# Patient Record
Sex: Male | Born: 1955 | Race: White | Hispanic: No | Marital: Married | Smoking: Never smoker
Health system: Southern US, Community
[De-identification: ages and names within clinical notes are randomized; demographics above are authoritative.]

## PROBLEM LIST (undated history)

## (undated) DIAGNOSIS — R1012 Left upper quadrant pain: Secondary | ICD-10-CM

## (undated) HISTORY — DX: Left upper quadrant pain: R10.12

---

## 2000-12-10 ENCOUNTER — Encounter: Admission: RE | Admit: 2000-12-10 | Discharge: 2000-12-10 | Payer: Self-pay | Admitting: Family Medicine

## 2000-12-10 ENCOUNTER — Encounter: Payer: Self-pay | Admitting: Family Medicine

## 2007-08-28 ENCOUNTER — Encounter: Admission: RE | Admit: 2007-08-28 | Discharge: 2007-08-28 | Payer: Self-pay | Admitting: Family Medicine

## 2009-09-02 ENCOUNTER — Ambulatory Visit: Payer: Self-pay | Admitting: Family Medicine

## 2009-09-02 DIAGNOSIS — R1012 Left upper quadrant pain: Secondary | ICD-10-CM

## 2009-09-02 HISTORY — DX: Left upper quadrant pain: R10.12

## 2009-09-06 LAB — CONVERTED CEMR LAB
ALT: 43 units/L (ref 0–53)
AST: 29 units/L (ref 0–37)
Albumin: 4.4 g/dL (ref 3.5–5.2)
Alkaline Phosphatase: 79 units/L (ref 39–117)
BUN: 21 mg/dL (ref 6–23)
Basophils Absolute: 0 10*3/uL (ref 0.0–0.1)
Basophils Relative: 0 % (ref 0.0–3.0)
Bilirubin, Direct: 0.1 mg/dL (ref 0.0–0.3)
CO2: 32 meq/L (ref 19–32)
Calcium: 9.5 mg/dL (ref 8.4–10.5)
Chloride: 105 meq/L (ref 96–112)
Cholesterol: 200 mg/dL (ref 0–200)
Creatinine, Ser: 1.1 mg/dL (ref 0.4–1.5)
Eosinophils Absolute: 0.1 10*3/uL (ref 0.0–0.7)
Eosinophils Relative: 1.1 % (ref 0.0–5.0)
GFR calc non Af Amer: 74.75 mL/min (ref >60)
Glucose, Bld: 93 mg/dL (ref 70–99)
HCT: 46.1 % (ref 39.0–52.0)
HDL: 40.2 mg/dL (ref >39.00)
Hemoglobin: 15.9 g/dL (ref 13.0–17.0)
LDL Cholesterol: 142 mg/dL — ABNORMAL HIGH (ref 0–99)
Lymphocytes Relative: 28.8 % (ref 12.0–46.0)
Lymphs Abs: 1.9 10*3/uL (ref 0.7–4.0)
MCHC: 34.5 g/dL (ref 30.0–36.0)
MCV: 93.7 fL (ref 78.0–100.0)
Monocytes Absolute: 0.7 10*3/uL (ref 0.1–1.0)
Monocytes Relative: 10.1 % (ref 3.0–12.0)
Neutro Abs: 4 10*3/uL (ref 1.4–7.7)
Neutrophils Relative %: 60 % (ref 43.0–77.0)
PSA: 0.76 ng/mL (ref 0.10–4.00)
Platelets: 159 10*3/uL (ref 150.0–400.0)
Potassium: 4.7 meq/L (ref 3.5–5.1)
RBC: 4.92 M/uL (ref 4.22–5.81)
RDW: 12.5 % (ref 11.5–14.6)
Sodium: 142 meq/L (ref 135–145)
TSH: 0.58 microintl units/mL (ref 0.35–5.50)
Total Bilirubin: 1.3 mg/dL — ABNORMAL HIGH (ref 0.3–1.2)
Total CHOL/HDL Ratio: 5
Total Protein: 7.1 g/dL (ref 6.0–8.3)
Triglycerides: 90 mg/dL (ref 0.0–149.0)
VLDL: 18 mg/dL (ref 0.0–40.0)
WBC: 6.7 10*3/uL (ref 4.5–10.5)

## 2009-09-26 ENCOUNTER — Ambulatory Visit: Payer: Self-pay | Admitting: Family Medicine

## 2009-09-26 LAB — CONVERTED CEMR LAB
OCCULT 1: NEGATIVE
OCCULT 2: NEGATIVE
OCCULT 3: NEGATIVE

## 2009-10-14 ENCOUNTER — Telehealth: Payer: Self-pay | Admitting: Family Medicine

## 2009-12-05 ENCOUNTER — Ambulatory Visit: Payer: Self-pay | Admitting: Family Medicine

## 2010-10-18 ENCOUNTER — Ambulatory Visit: Payer: Self-pay | Admitting: Family Medicine

## 2010-10-18 LAB — CONVERTED CEMR LAB
ALT: 46 units/L (ref 0–53)
AST: 33 units/L (ref 0–37)
Albumin: 4 g/dL (ref 3.5–5.2)
Alkaline Phosphatase: 62 units/L (ref 39–117)
BUN: 24 mg/dL — ABNORMAL HIGH (ref 6–23)
Basophils Absolute: 0 10*3/uL (ref 0.0–0.1)
Basophils Relative: 0.6 % (ref 0.0–3.0)
Bilirubin Urine: NEGATIVE
Bilirubin, Direct: 0.1 mg/dL (ref 0.0–0.3)
Blood in Urine, dipstick: NEGATIVE
CO2: 26 meq/L (ref 19–32)
Calcium: 9.1 mg/dL (ref 8.4–10.5)
Chloride: 107 meq/L (ref 96–112)
Cholesterol: 220 mg/dL — ABNORMAL HIGH (ref 0–200)
Creatinine, Ser: 1 mg/dL (ref 0.4–1.5)
Direct LDL: 141.7 mg/dL
Eosinophils Absolute: 0.1 10*3/uL (ref 0.0–0.7)
Eosinophils Relative: 1.7 % (ref 0.0–5.0)
GFR calc non Af Amer: 83.07 mL/min (ref >60)
Glucose, Bld: 77 mg/dL (ref 70–99)
Glucose, Urine, Semiquant: NEGATIVE
HCT: 47.2 % (ref 39.0–52.0)
HDL: 42.4 mg/dL (ref >39.00)
Hemoglobin: 15.8 g/dL (ref 13.0–17.0)
Ketones, urine, test strip: NEGATIVE
Lymphocytes Relative: 30.2 % (ref 12.0–46.0)
Lymphs Abs: 2.1 10*3/uL (ref 0.7–4.0)
MCHC: 33.4 g/dL (ref 30.0–36.0)
MCV: 95.9 fL (ref 78.0–100.0)
Monocytes Absolute: 0.7 10*3/uL (ref 0.1–1.0)
Monocytes Relative: 10.6 % (ref 3.0–12.0)
Neutro Abs: 4 10*3/uL (ref 1.4–7.7)
Neutrophils Relative %: 56.9 % (ref 43.0–77.0)
Nitrite: NEGATIVE
PSA: 0.57 ng/mL (ref 0.10–4.00)
Platelets: 179 10*3/uL (ref 150.0–400.0)
Potassium: 3.7 meq/L (ref 3.5–5.1)
Protein, U semiquant: NEGATIVE
RBC: 4.92 M/uL (ref 4.22–5.81)
RDW: 13.1 % (ref 11.5–14.6)
Sodium: 141 meq/L (ref 135–145)
Specific Gravity, Urine: 1.025
TSH: 1.04 microintl units/mL (ref 0.35–5.50)
Total Bilirubin: 1 mg/dL (ref 0.3–1.2)
Total CHOL/HDL Ratio: 5
Total Protein: 6.6 g/dL (ref 6.0–8.3)
Triglycerides: 150 mg/dL — ABNORMAL HIGH (ref 0.0–149.0)
Urobilinogen, UA: 0.2
VLDL: 30 mg/dL (ref 0.0–40.0)
WBC Urine, dipstick: NEGATIVE
WBC: 7.1 10*3/uL (ref 4.5–10.5)
pH: 5.5

## 2010-10-27 ENCOUNTER — Ambulatory Visit: Payer: Self-pay | Admitting: Family Medicine

## 2011-01-30 NOTE — Assessment & Plan Note (Signed)
Summary: cpx/flu shot/cjr   Vital Signs:  Patient profile:   55 year old male Height:      71.25 inches Weight:      226 pounds BMI:     31.41 Temp:     98.3 degrees F oral Pulse rate:   60 / minute Pulse rhythm:   regular Resp:     12 per minute BP sitting:   116 / 86  (left arm) Cuff size:   regular  Vitals Entered By: Sid Falcon LPN (October 27, 2010 3:06 PM)  Nutrition Counseling: Patient's BMI is greater than 25 and therefore counseled on weight management options.  History of Present Illness: Here for CPE.  Exercises few times per week. Colonoscopy couple of years ago with adenomatous polyp. Flu vaccine given today  Clinical Review Panels:  Prevention   Last PSA:  0.57 (10/18/2010)  Immunizations   Last Flu Vaccine:  Fluvax 3+ (10/27/2010)  Lipid Management   Cholesterol:  220 (10/18/2010)   LDL (bad choesterol):  142 (09/02/2009)   HDL (good cholesterol):  42.40 (10/18/2010)  Diabetes Management   Creatinine:  1.0 (10/18/2010)   Last Flu Vaccine:  Fluvax 3+ (10/27/2010)  CBC   WBC:  7.1 (10/18/2010)   RBC:  4.92 (10/18/2010)   Hgb:  15.8 (10/18/2010)   Hct:  47.2 (10/18/2010)   Platelets:  179.0 (10/18/2010)   MCV  95.9 (10/18/2010)   MCHC  33.4 (10/18/2010)   RDW  13.1 (10/18/2010)   PMN:  56.9 (10/18/2010)   Lymphs:  30.2 (10/18/2010)   Monos:  10.6 (10/18/2010)   Eosinophils:  1.7 (10/18/2010)   Basophil:  0.6 (10/18/2010)  Complete Metabolic Panel   Glucose:  77 (10/18/2010)   Sodium:  141 (10/18/2010)   Potassium:  3.7 (10/18/2010)   Chloride:  107 (10/18/2010)   CO2:  26 (10/18/2010)   BUN:  24 (10/18/2010)   Creatinine:  1.0 (10/18/2010)   Albumin:  4.0 (10/18/2010)   Total Protein:  6.6 (10/18/2010)   Calcium:  9.1 (10/18/2010)   Total Bili:  1.0 (10/18/2010)   Alk Phos:  62 (10/18/2010)   SGPT (ALT):  46 (10/18/2010)   SGOT (AST):  33 (10/18/2010)   Allergies (verified): No Known Drug Allergies  Past History:  Family  History: Last updated: 09/02/2009 Family history atrial fib, father  Social History: Last updated: 09/02/2009 Occupation:  Interior and spatial designer of facilities, [employer] Married Never Smoked Alcohol use-yes, occasional red wine Regular exercise-yes  Risk Factors: Exercise: yes (09/02/2009)  Risk Factors: Smoking Status: never (09/02/2009) PMH-FH-SH reviewed for relevance  Review of Systems  The patient denies anorexia, fever, weight loss, weight gain, vision loss, decreased hearing, hoarseness, chest pain, syncope, dyspnea on exertion, peripheral edema, prolonged cough, headaches, hemoptysis, abdominal pain, melena, hematochezia, severe indigestion/heartburn, hematuria, incontinence, genital sores, muscle weakness, suspicious skin lesions, transient blindness, difficulty walking, depression, unusual weight change, abnormal bleeding, enlarged lymph nodes, and testicular masses.    Physical Exam  General:  Well-developed,well-nourished,in no acute distress; alert,appropriate and cooperative throughout examination Head:  Normocephalic and atraumatic without obvious abnormalities. No apparent alopecia or balding. Eyes:  No corneal or conjunctival inflammation noted. EOMI. Perrla. Funduscopic exam benign, without hemorrhages, exudates or papilledema. Vision grossly normal. Ears:  External ear exam shows no significant lesions or deformities.  Otoscopic examination reveals clear canals, tympanic membranes are intact bilaterally without bulging, retraction, inflammation or discharge. Hearing is grossly normal bilaterally. Mouth:  Oral mucosa and oropharynx without lesions or exudates.  Teeth in good repair.  Neck:  No deformities, masses, or tenderness noted. Chest Wall:  small rounded sebaceous cyst ant chest wall Lungs:  Normal respiratory effort, chest expands symmetrically. Lungs are clear to auscultation, no crackles or wheezes. Heart:  Normal rate and regular rhythm. S1 and S2 normal without  gallop, murmur, click, rub or other extra sounds. Abdomen:  Bowel sounds positive,abdomen soft and non-tender without masses, organomegaly or hernias noted. Msk:  No deformity or scoliosis noted of thoracic or lumbar spine.   Extremities:  No clubbing, cyanosis, edema, or deformity noted with normal full range of motion of all joints.   Neurologic:  No cranial nerve deficits noted. Station and gait are normal. Plantar reflexes are down-going bilaterally. DTRs are symmetrical throughout. Sensory, motor and coordinative functions appear intact. Skin:  Intact without suspicious lesions or rashes Cervical Nodes:  No lymphadenopathy noted Psych:  normally interactive, good eye contact, not anxious appearing, and not depressed appearing.     Impression & Recommendations:  Problem # 1:  ROUTINE GENERAL MEDICAL EXAM@[email or web address]  CARE FACL (ICD-V70.0) labs reviewed with patient. Flu vaccine given. Repeat colonoscopy next year.  Complete Medication List: 1)  Glucosamine 500 Mg Caps (Glucosamine sulfate) .... Once daily 2)  Multi Vitamin Mens Tabs (Multiple vitamin) .... Once daily 3)  Prilosec Otc 20 Mg Tbec (Omeprazole magnesium) .... As needed  Other Orders: Admin 1st Vaccine (16109) Flu Vaccine 8yrs + [phone])   Orders Added: 1)  Est. Patient 40-64 years [99396] 2)  Admin 1st Vaccine [90471] 3)  Flu Vaccine 14yrs + [09811]   Flu Vaccine Consent Questions     Do you have a history of severe allergic reactions to this vaccine? no    Any prior history of allergic reactions to egg and/or gelatin? no    Do you have a sensitivity to the preservative Thimersol? no    Do you have a past history of Guillan-Barre Syndrome? no    Do you currently have an acute febrile illness? no    Have you ever had a severe reaction to latex? no    Vaccine information given and explained to patient? yes    Are you currently pregnant? no    Lot Number:[id number]   Exp Date:06/30/2011   Site Given  Left Deltoid  IM         .[email or web address]

## 2011-11-29 ENCOUNTER — Telehealth: Payer: Self-pay | Admitting: Family Medicine

## 2011-11-29 NOTE — Telephone Encounter (Signed)
yes

## 2011-11-29 NOTE — Telephone Encounter (Signed)
Pt would like cpx before 12-31-11. Can I work pt in?

## 2011-11-30 NOTE — Telephone Encounter (Signed)
Pt has been sch cpx 12-12-11 2:00pm

## 2011-12-05 ENCOUNTER — Other Ambulatory Visit (INDEPENDENT_AMBULATORY_CARE_PROVIDER_SITE_OTHER): Payer: 59

## 2011-12-05 DIAGNOSIS — Z0000 Encounter for general adult medical examination without abnormal findings: Secondary | ICD-10-CM

## 2011-12-05 DIAGNOSIS — Z23 Encounter for immunization: Secondary | ICD-10-CM

## 2011-12-05 LAB — BASIC METABOLIC PANEL
BUN: 22 mg/dL (ref 6–23)
CO2: 30 mEq/L (ref 19–32)
Calcium: 9.2 mg/dL (ref 8.4–10.5)
Chloride: 104 mEq/L (ref 96–112)
Creatinine, Ser: 1 mg/dL (ref 0.4–1.5)
GFR: 83.68 mL/min (ref >60.00)
Glucose, Bld: 85 mg/dL (ref 70–99)
Potassium: 3.9 mEq/L (ref 3.5–5.1)
Sodium: 139 mEq/L (ref 135–145)

## 2011-12-05 LAB — CBC WITH DIFFERENTIAL/PLATELET
Basophils Relative: 0.6 % (ref 0.0–3.0)
Eosinophils Absolute: 0.1 10*3/uL (ref 0.0–0.7)
Eosinophils Relative: 1.4 % (ref 0.0–5.0)
HCT: 46.5 % (ref 39.0–52.0)
Hemoglobin: 15.8 g/dL (ref 13.0–17.0)
Lymphs Abs: 2 10*3/uL (ref 0.7–4.0)
MCHC: 33.9 g/dL (ref 30.0–36.0)
MCV: 93.8 fl (ref 78.0–100.0)
Monocytes Absolute: 0.6 10*3/uL (ref 0.1–1.0)
Neutro Abs: 3.3 10*3/uL (ref 1.4–7.7)
RBC: 4.96 Mil/uL (ref 4.22–5.81)
WBC: 6.1 10*3/uL (ref 4.5–10.5)

## 2011-12-05 LAB — HEPATIC FUNCTION PANEL
ALT: 29 U/L (ref 0–53)
Albumin: 4.2 g/dL (ref 3.5–5.2)
Bilirubin, Direct: 0.1 mg/dL (ref 0.0–0.3)
Total Protein: 6.7 g/dL (ref 6.0–8.3)

## 2011-12-05 LAB — LDL CHOLESTEROL, DIRECT: Direct LDL: 150.4 mg/dL

## 2011-12-05 LAB — POCT URINALYSIS DIPSTICK
Bilirubin, UA: NEGATIVE
Ketones, UA: NEGATIVE
Leukocytes, UA: NEGATIVE
Spec Grav, UA: 1.015
pH, UA: 7

## 2011-12-05 LAB — LIPID PANEL
Cholesterol: 243 mg/dL — ABNORMAL HIGH (ref 0–200)
Triglycerides: 134 mg/dL (ref 0.0–149.0)

## 2011-12-10 ENCOUNTER — Telehealth: Payer: Self-pay | Admitting: Internal Medicine

## 2011-12-10 NOTE — Telephone Encounter (Signed)
Pt would like blood work results °

## 2011-12-10 NOTE — Telephone Encounter (Signed)
I spoke with pt, labs faxed to [facility], MD office today for appt.

## 2011-12-12 ENCOUNTER — Ambulatory Visit (INDEPENDENT_AMBULATORY_CARE_PROVIDER_SITE_OTHER): Payer: 59 | Admitting: Family Medicine

## 2011-12-12 ENCOUNTER — Encounter: Payer: Self-pay | Admitting: Family Medicine

## 2011-12-12 VITALS — BP 120/82 | HR 60 | Temp 98.4°F | Resp 12 | Ht 72.0 in | Wt 236.0 lb

## 2011-12-12 DIAGNOSIS — Z0000 Encounter for general adult medical examination without abnormal findings: Secondary | ICD-10-CM

## 2011-12-12 MED ORDER — TETANUS-DIPHTH-ACELL PERTUSSIS 5-2.5-18.5 LF-MCG/0.5 IM SUSP: 0.5000 mL | Freq: Once | INTRAMUSCULAR | Status: DC

## 2011-12-12 NOTE — Progress Notes (Signed)
  Subjective:    Patient ID: William English, male    DOB: [birth year], 55 y.o.   MRN: [id number]  HPI  Patient here for complete physical. He has no chronic medical problems. Takes no regular medications. Nonsmoker. No alcohol use. Last tetanus 2002. Exercises with walking and occasional running. Colonoscopy up to date. Past medical history, social history, and family history reviewed and as below.  Past Medical History  Diagnosis Date  . Abdominal pain, left upper quadrant 09/02/2009   History reviewed. No pertinent past surgical history.  reports that he has never smoked. He does not have any smokeless tobacco history on file. His alcohol and drug histories not on file. family history includes Heart disease in his father. No Known Allergies    Review of Systems  Constitutional: Negative for fever, activity change, appetite change and fatigue.  HENT: Negative for ear pain, congestion and trouble swallowing.   Eyes: Negative for pain and visual disturbance.  Respiratory: Negative for cough, shortness of breath and wheezing.   Cardiovascular: Negative for chest pain and palpitations.  Gastrointestinal: Negative for nausea, vomiting, abdominal pain, diarrhea, constipation, blood in stool, abdominal distention and rectal pain.  Genitourinary: Negative for dysuria, hematuria and testicular pain.  Musculoskeletal: Negative for joint swelling and arthralgias.  Skin: Negative for rash.  Neurological: Negative for dizziness, syncope and headaches.  Hematological: Negative for adenopathy.  Psychiatric/Behavioral: Negative for confusion and dysphoric mood.       Objective:   Physical Exam  Constitutional: He is oriented to person, place, and time. He appears well-developed and well-nourished. No distress.  HENT:  Head: Normocephalic and atraumatic.  Right Ear: External ear normal.  Left Ear: External ear normal.  Mouth/Throat: Oropharynx is clear and moist.  Eyes: Conjunctivae and EOM are  normal. Pupils are equal, round, and reactive to light.  Neck: Normal range of motion. Neck supple. No thyromegaly present.  Cardiovascular: Normal rate, regular rhythm and normal heart sounds.   No murmur heard. Pulmonary/Chest: No respiratory distress. He has no wheezes. He has no rales.       Sebaceous cyst MID upper chest wall. No signs of inflammation such as warmth or erythema. Nontender.  Abdominal: Soft. Bowel sounds are normal. He exhibits no distension and no mass. There is no tenderness. There is no rebound and no guarding.  Musculoskeletal: He exhibits no edema.  Lymphadenopathy:    He has no cervical adenopathy.  Neurological: He is alert and oriented to person, place, and time. He displays normal reflexes. No cranial nerve deficit.  Skin: No rash noted.  Psychiatric: He has a normal mood and affect. His behavior is normal.          Assessment & Plan:  Healthy 55 year old male. Discussed weight control and regular exercise. Labs reviewed with patient. Reduce saturated fats. Increase soluble fiber intake.  Tetanus booster given. Due for repeat colonoscopy next year

## 2011-12-26 ENCOUNTER — Encounter: Payer: Self-pay | Admitting: Family Medicine

## 2012-01-04 ENCOUNTER — Ambulatory Visit (INDEPENDENT_AMBULATORY_CARE_PROVIDER_SITE_OTHER): Payer: 59 | Admitting: Family Medicine

## 2012-01-04 ENCOUNTER — Encounter: Payer: Self-pay | Admitting: Family Medicine

## 2012-01-04 VITALS — BP 130/70 | Temp 98.1°F | Wt 242.0 lb

## 2012-01-04 DIAGNOSIS — Z299 Encounter for prophylactic measures, unspecified: Secondary | ICD-10-CM

## 2012-01-04 DIAGNOSIS — Z23 Encounter for immunization: Secondary | ICD-10-CM

## 2012-01-04 DIAGNOSIS — Z418 Encounter for other procedures for purposes other than remedying health state: Secondary | ICD-10-CM

## 2012-01-04 MED ORDER — ATOVAQUONE-PROGUANIL HCL 250-100 MG PO TABS: ORAL_TABLET | ORAL | Status: DC

## 2012-01-04 MED ORDER — TYPHOID VACCINE PO CPDR: 1.0000 | DELAYED_RELEASE_CAPSULE | ORAL | Status: AC

## 2012-01-04 NOTE — Progress Notes (Signed)
  Subjective:    Patient ID: William English, male    DOB: 1956/07/15, 56 y.o.   MRN: [id number]  HPI  Here for vaccinations for upcoming travel. Planned mission trip to [place] in March.  Tetanus up to date. Influenza today. No history of hepatitis A. 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 We needs typhoid vaccine and malaria prevention. He has no chronic medical problems. No immune problems. No known drug allergies.   Review of Systems  Constitutional: Negative for fever, chills and unexpected weight change.  Respiratory: Negative for cough and shortness of breath.   Cardiovascular: Negative for chest pain.       Objective:   Physical Exam  Constitutional: He appears well-developed and well-nourished.  Cardiovascular: Normal rate and regular rhythm.   No murmur heard. Pulmonary/Chest: Effort normal and breath sounds normal. No respiratory distress. He has no wheezes. He has no rales.          Assessment & Plan:  Immunization prevention. Hepatitis A vaccine given with recommendation for booster in 6 months. Oral typhoid vaccine given. No contraindications. Malaria prevention with Malarone. He knows not to overlap treatment of Vivotif with malarone.

## 2012-01-25 ENCOUNTER — Encounter: Payer: Self-pay | Admitting: Family Medicine

## 2012-01-25 ENCOUNTER — Ambulatory Visit (INDEPENDENT_AMBULATORY_CARE_PROVIDER_SITE_OTHER): Payer: 59 | Admitting: Family Medicine

## 2012-01-25 VITALS — BP 130/78 | Temp 98.0°F

## 2012-01-25 DIAGNOSIS — Z23 Encounter for immunization: Secondary | ICD-10-CM

## 2012-01-25 DIAGNOSIS — L600 Ingrowing nail: Secondary | ICD-10-CM

## 2012-01-25 DIAGNOSIS — IMO0002 Reserved for concepts with insufficient information to code with codable children: Secondary | ICD-10-CM

## 2012-01-25 DIAGNOSIS — Z7189 Other specified counseling: Secondary | ICD-10-CM

## 2012-01-25 MED ORDER — CIPROFLOXACIN HCL 500 MG PO TABS: ORAL_TABLET | ORAL | Status: DC

## 2012-01-25 NOTE — Progress Notes (Signed)
  Subjective:    Patient ID: William English, male    DOB: Mar 09, 1956, 56 y.o.   MRN: [id number]  HPI  Here for several issues as follows  Ingrown right great toenail. Present for several months. No granulation tissue or drainage. Request having medial wedge excised.  Becoming more painful with ambulation.  No recent secondary infection.  Upcoming trip to [place]. Someone had suggested repeat MMR and possibly polio vaccine. Also requesting Cipro for traveler's diarrhea. He recently had oral typhoid vaccine and has a prescription for malaria prevention. He's not sure if he's had one or 2 previous measles mumps rubella immunizations.   Review of Systems  Constitutional: Negative for fever and chills.  Respiratory: Negative for cough.   Hematological: Negative for adenopathy.       Objective:   Physical Exam  Constitutional: He appears well-developed and well-nourished.  Cardiovascular: Normal rate and regular rhythm.   Musculoskeletal:       Ingrown right great toe medial border. No granulation tissue minimal tenderness along the ingrown border          Assessment & Plan:  #1 ingrown right great toenail. After discussing risks and benefits patient consented to excision. Anesthesia with digital block 1% plain Xylocaine. Prep with Betadine. Removed involved one quarter of the involved nail without difficulty. Minimal bleeding. Antibiotic and dressing applied. #2 immunizations. Cannot confirm second MMR. MMR booster given. Also prescription for Cipro for traveler's diarrhea to use prn.  His other immunizations appear to be up to date.

## 2012-01-25 NOTE — Patient Instructions (Signed)
Keep toe dry for 24 hours then clean daily with soap and water. Topical antibiotic daily for 3-4 days Follow up promptly for signs of infection such as redness or pus like drainage.

## 2012-01-31 ENCOUNTER — Telehealth: Payer: Self-pay | Admitting: Family Medicine

## 2012-01-31 NOTE — Telephone Encounter (Signed)
Pt will be travelling to [place] mid-march requesting plain polio vaccine.

## 2012-02-01 NOTE — Telephone Encounter (Signed)
Pt informed we can order it from [facility], would cost pt $239.53.  Pt is going to check with the health department and will let us know.

## 2012-05-08 ENCOUNTER — Ambulatory Visit (INDEPENDENT_AMBULATORY_CARE_PROVIDER_SITE_OTHER): Payer: 59 | Admitting: Family Medicine

## 2012-05-08 ENCOUNTER — Encounter: Payer: Self-pay | Admitting: Family Medicine

## 2012-05-08 VITALS — BP 110/70 | Temp 98.0°F | Wt 237.0 lb

## 2012-05-08 DIAGNOSIS — R1011 Right upper quadrant pain: Secondary | ICD-10-CM

## 2012-05-08 LAB — HEPATIC FUNCTION PANEL
ALT: 28 U/L (ref 0–53)
AST: 25 U/L (ref 0–37)
Albumin: 4 g/dL (ref 3.5–5.2)
Alkaline Phosphatase: 56 U/L (ref 39–117)
Total Protein: 6.9 g/dL (ref 6.0–8.3)

## 2012-05-08 LAB — CBC WITH DIFFERENTIAL/PLATELET
Basophils Relative: 0.6 % (ref 0.0–3.0)
Eosinophils Relative: 1.3 % (ref 0.0–5.0)
HCT: 45.3 % (ref 39.0–52.0)
Hemoglobin: 15.2 g/dL (ref 13.0–17.0)
Lymphocytes Relative: 32.6 % (ref 12.0–46.0)
Lymphs Abs: 2 10*3/uL (ref 0.7–4.0)
Monocytes Relative: 9.5 % (ref 3.0–12.0)
Neutro Abs: 3.5 10*3/uL (ref 1.4–7.7)
RBC: 4.85 Mil/uL (ref 4.22–5.81)
RDW: 13 % (ref 11.5–14.6)
WBC: 6.3 10*3/uL (ref 4.5–10.5)

## 2012-05-08 NOTE — Progress Notes (Signed)
  Subjective:    Patient ID: William English, male    DOB: Jul 01, 1956, 56 y.o.   MRN: [id number]  HPI  Two-week history of intermittent right upper quadrant abdominal pain. The pain varies somewhat in intensity and quality is somewhat of an achy quality. No clear exacerbating factors. It occurs both day and night. Not clearly related to eating. He had similar type episode about 5 years ago with normal ultrasound then. He had recent colonoscopy which was unremarkable-other than benign polyps.  Denies any appetite or weight changes. No nausea or vomiting. Pain does not radiate. No clear exacerbating factors. No alleviating factors. Pain is moderate in intensity. Has not seen any associated skin rashes. No change in bowel habits.  Past Medical History  Diagnosis Date  . Abdominal pain, left upper quadrant 09/02/2009   No past surgical history on file.  reports that he has never smoked. He does not have any smokeless tobacco history on file. His alcohol and drug histories not on file. family history includes Heart disease in his father. No Known Allergies    Review of Systems  Constitutional: Negative for fever, chills, appetite change and unexpected weight change.  Respiratory: Negative for cough and shortness of breath.   Cardiovascular: Negative for chest pain.  Gastrointestinal: Positive for abdominal pain. Negative for nausea, vomiting, diarrhea, constipation and blood in stool.  Genitourinary: Negative for dysuria.  Neurological: Negative for dizziness.       Objective:   Physical Exam  Constitutional: He appears well-developed and well-nourished.  Cardiovascular: Normal rate and regular rhythm.   Pulmonary/Chest: Effort normal and breath sounds normal. No respiratory distress. He has no wheezes. He has no rales.  Abdominal: Soft. Bowel sounds are normal. He exhibits no distension and no mass. There is no rebound and no guarding.       Minimal tenderness right upper quadrant to deep  palpation. No hepatomegaly. No guarding          Assessment & Plan:  Intermittent right upper quadrant abdominal pain. Etiology unclear. Check labs with hepatic panel and CBC. Repeat abdominal ultrasound since his than several years ago since last.

## 2012-05-09 ENCOUNTER — Ambulatory Visit
Admission: RE | Admit: 2012-05-09 | Discharge: 2012-05-09 | Disposition: A | Discharge status: Home / Self Care | Payer: PRIVATE HEALTH INSURANCE | Source: Ambulatory Visit | Attending: Family Medicine | Admitting: Family Medicine

## 2012-05-09 DIAGNOSIS — R1011 Right upper quadrant pain: Secondary | ICD-10-CM

## 2012-05-09 NOTE — Progress Notes (Signed)
Quick Note:  Pt informed ______ 

## 2012-05-13 ENCOUNTER — Other Ambulatory Visit: Payer: 59

## 2012-06-11 ENCOUNTER — Ambulatory Visit (INDEPENDENT_AMBULATORY_CARE_PROVIDER_SITE_OTHER): Payer: PRIVATE HEALTH INSURANCE | Admitting: Family Medicine

## 2012-06-11 DIAGNOSIS — Z299 Encounter for prophylactic measures, unspecified: Secondary | ICD-10-CM

## 2012-06-11 DIAGNOSIS — Z23 Encounter for immunization: Secondary | ICD-10-CM

## 2012-07-24 ENCOUNTER — Ambulatory Visit (INDEPENDENT_AMBULATORY_CARE_PROVIDER_SITE_OTHER): Payer: PRIVATE HEALTH INSURANCE | Admitting: Family Medicine

## 2012-07-24 ENCOUNTER — Encounter: Payer: Self-pay | Admitting: Family Medicine

## 2012-07-24 VITALS — BP 112/78 | Temp 98.0°F | Wt 240.0 lb

## 2012-07-24 DIAGNOSIS — L600 Ingrowing nail: Secondary | ICD-10-CM

## 2012-07-24 NOTE — Progress Notes (Signed)
  Subjective:    Patient ID: William English, male    DOB: 12-03-1956, 56 y.o.   MRN: [id number]  HPI  Recurrent right great toenail. Partial excision back in the winter. This healed well. He's recently had a couple slivers of nail he tried to cut out himself without success. No granulation tissue. No drainage. He has some mild pain with ambulation.   Review of Systems  Constitutional: Negative for fever and chills.  Musculoskeletal: Negative for gait problem.       Objective:   Physical Exam  Constitutional: He appears well-developed and well-nourished.  Cardiovascular: Normal rate and regular rhythm.   Musculoskeletal:       Right great toe reveals that he has some recurrent ingrown nail along the medial border. He is a couple of pieces of nail he tried to trim out unsuccessfully. No granulation tissue. No cellulitis changes. No drainage.          Assessment & Plan:  Recurrent right great toe ingrown. Discussed risk and benefits of repeat excision and patient consents. Toe prepped with Betadine. Digital block 1% plain Xylocaine. Removed involved portion of ingrown nail without difficulty. Minimal bleeding. Antibiotic and dressing applied. Wound care structure given. If he has recurrence again consider podiatry referral for consideration of nail matrix ablation

## 2012-07-31 ENCOUNTER — Ambulatory Visit: Payer: PRIVATE HEALTH INSURANCE | Admitting: Family Medicine

## 2012-09-05 ENCOUNTER — Ambulatory Visit (INDEPENDENT_AMBULATORY_CARE_PROVIDER_SITE_OTHER): Payer: PRIVATE HEALTH INSURANCE | Admitting: Family Medicine

## 2012-09-05 ENCOUNTER — Encounter: Payer: Self-pay | Admitting: Family Medicine

## 2012-09-05 VITALS — BP 118/90 | Temp 97.8°F | Wt 241.0 lb

## 2012-09-05 DIAGNOSIS — R599 Enlarged lymph nodes, unspecified: Secondary | ICD-10-CM

## 2012-09-05 DIAGNOSIS — R591 Generalized enlarged lymph nodes: Secondary | ICD-10-CM

## 2012-09-05 NOTE — Patient Instructions (Addendum)
Touch base in 2 weeks if lymph nodes not decreasing in size.

## 2012-09-05 NOTE — Progress Notes (Signed)
  Subjective:    Patient ID: William English, male    DOB: May 07, 1956, 56 y.o.   MRN: [id number]  HPI  Small left chest wall mass noted one week ago. Asymptomatic. No nipple discharge. No nipple inversion. No appetite or weight changes. No other lymphadenopathy noted. No skin changes. Patient generally feels well. He has not noted any other adenopathy in the neck or axillary region   Review of Systems  Constitutional: Negative for fever, chills, appetite change and unexpected weight change.  Gastrointestinal: Negative for abdominal pain.  Hematological: Does not bruise/bleed easily.       Objective:   Physical Exam  Constitutional: He appears well-developed and well-nourished.  Cardiovascular: Normal rate and regular rhythm.   Pulmonary/Chest: Effort normal and breath sounds normal. No respiratory distress. He has no wheezes. He has no rales.       Patient has no breast masses. Around the 1:00 position just lateral and superior to the area all region he has small palpated mass which is very likely lymph node. This is mobile and about 1/2 cm diameter. Nontender. No skin changes. No axillary adenopathy. No supraclavicular adenopathy. No cervical adenopathy          Assessment & Plan:  Small lymph node left lateral breast region. Likely benign. No other adenopathy noted. No breast masses. Observe for now. Touch base 2 weeks if this is not resolving and sooner if getting larger or any other nodes noted

## 2012-09-29 ENCOUNTER — Telehealth: Payer: Self-pay | Admitting: Family Medicine

## 2012-09-29 DIAGNOSIS — R591 Generalized enlarged lymph nodes: Secondary | ICD-10-CM

## 2012-09-29 NOTE — Telephone Encounter (Signed)
Pt was seen on 09-05-2012. Pt is still have experiencing swollen lymph nodes in left breast

## 2012-09-30 NOTE — Telephone Encounter (Signed)
Pt informed on cell VM 

## 2012-09-30 NOTE — Telephone Encounter (Signed)
I recommend refer to general surgeon for further evaluation.  I will make referral.

## 2012-10-14 ENCOUNTER — Ambulatory Visit (INDEPENDENT_AMBULATORY_CARE_PROVIDER_SITE_OTHER): Payer: PRIVATE HEALTH INSURANCE | Admitting: Surgery

## 2012-10-14 ENCOUNTER — Encounter (INDEPENDENT_AMBULATORY_CARE_PROVIDER_SITE_OTHER): Payer: Self-pay | Admitting: Surgery

## 2012-10-14 VITALS — BP 142/60 | HR 76 | Temp 97.5°F | Resp 16 | Ht 72.0 in | Wt 242.4 lb

## 2012-10-14 DIAGNOSIS — N6089 Other benign mammary dysplasias of unspecified breast: Secondary | ICD-10-CM | POA: Insufficient documentation

## 2012-10-14 DIAGNOSIS — K8020 Calculus of gallbladder without cholecystitis without obstruction: Secondary | ICD-10-CM

## 2012-10-14 DIAGNOSIS — K8050 Calculus of bile duct without cholangitis or cholecystitis without obstruction: Secondary | ICD-10-CM

## 2012-10-14 NOTE — Progress Notes (Signed)
Patient ID: William English, male   DOB: 06-05-1956, 56 y.o.   MRN: [id number]  Chief Complaint  Patient presents with  . Pre-op Exam    eval of lymphademopathy    HPI William English is a 56 y.o. male.  Referred by Dr. Evelena Peat for evaluation of left axillary lymphadenopathy HPI This is a healthy 56 year old male presents with recent onset of a palpable mass in the left side of his chest near the axilla. The patient is unsure how long this has been present. This area has been closely observed for the last 6 weeks and has not changed in size. There is no sign of any inflammation. No systemic symptoms of night sweats, easy bruising or significant fatigue. The patient also has had long-standing symptoms of postprandial right upper quadrant discomfort.This has been associated with significant bloating, belching, flatus, and mild nausea. He denies any significant diarrhea. Ultrasound performed in May of 2013 showed no sign of gallstones.  PMH - none  History reviewed. No pertinent past surgical history.  Family History  Problem Relation Age of Onset  . Heart disease Father     atrial fibrillation    Social History History  Substance Use Topics  . Smoking status: Never Smoker   . Smokeless tobacco: Never Used  . Alcohol Use: Yes     occ glass of wine with dinner    No Known Allergies  Current Outpatient Prescriptions  Medication Sig Dispense Refill  . Ascorbic Acid (VITAMIN C) 100 MG tablet Take 100 mg by mouth daily.      Marland Kitchen glucosamine-chondroitin 500-400 MG tablet Take 1 tablet by mouth daily.      . Multiple Vitamins-Minerals (MULTIVITAMIN WITH MINERALS) tablet Take 1 tablet by mouth daily.      Marland Kitchen omeprazole (PRILOSEC) 20 MG capsule Take 20 mg by mouth as needed.         Review of Systems Review of Systems  Constitutional: Negative for fever, chills and unexpected weight change.  HENT: Negative for hearing loss, congestion, sore throat, trouble swallowing and voice change.     Eyes: Negative for visual disturbance.  Respiratory: Negative for cough and wheezing.   Cardiovascular: Negative for chest pain, palpitations and leg swelling.  Gastrointestinal: Positive for nausea, abdominal pain and abdominal distention. Negative for vomiting, diarrhea, constipation, blood in stool, anal bleeding and rectal pain.  Genitourinary: Negative for hematuria and difficulty urinating.  Musculoskeletal: Negative for arthralgias.  Skin: Negative for rash and wound.  Neurological: Negative for seizures, syncope, weakness and headaches.  Hematological: Negative for adenopathy. Does not bruise/bleed easily.  Psychiatric/Behavioral: Negative for confusion.    Blood pressure 142/60, pulse 76, temperature 97.5 F (36.4 C), temperature source Temporal, resp. rate 16, height 6' (1.829 m), weight 242 lb 6.4 oz (109.952 kg).  Physical Exam Physical Exam WDWN in NAD HEENT:  EOMI, sclera anicteric Neck:  No masses, no thyromegaly Lungs:  CTA bilaterally; normal respiratory effort Chest - 2 cm sebaceous cyst overlying the sternum - not inflamed or infected  Left chest - anterior to the axilla - 1.5 cm oval-shaped subcutaneous mass, well-demarcated, mobile CV:  Regular rate and rhythm; no murmurs Abd:  +bowel sounds, soft, non-tender, no masses Ext:  Well-perfused; no edema Skin:  Warm, dry; no sign of jaundice  Data Reviewed Clinical Data: Right upper quadrant abdominal pain  COMPLETE ABDOMINAL ULTRASOUND  Comparison: None.  Findings:  Gallbladder: The gallbladder is visualized and no gallstones are  noted. There is no pain over the  gallbladder with compression.  Common bile duct: The common bile duct is normal measuring 3.9 mm  in diameter.  Liver: The liver has a normal echogenic pattern. No ductal  dilatation is seen.  IVC: Appears normal.  Pancreas: The pancreas is partially obscured by bowel gas.  Spleen: The spleen is normal measuring 5.1 cm sagittally.  Right Kidney:  No hydronephrosis is seen. The right kidney  measures 13.5 cm sagittally.  Left Kidney: No hydronephrosis is seen. The left kidney measures  13.5 cm.  Abdominal aorta: The abdominal aorta is normal in caliber,  partially obscured by bowel gas.  IMPRESSION:  1. No gallstones. No ductal dilatation.  2. Portions of the pancreas and abdominal aorta are obscured by  bowel gas.  Original Report Authenticated By: Juline Patch, M.D.   Assessment    1.  Sebaceous cyst of the left chest - not likely to be lymph node, due to the mobility and superficial location of the mass 2.  Sebaceous cyst of the mid-chest 3.  Biliary colic    Plan    1.  Recommend excision of both sebaceous cysts under local anesthetic here in the office. 2.  HIDA scan to rule out biliary dyskinesia.   Will schedule office procedure soon.       Bralen Wiltgen K. 10/14/2012, 5:07 PM

## 2012-10-23 ENCOUNTER — Encounter (HOSPITAL_COMMUNITY)
Admission: RE | Admit: 2012-10-23 | Discharge: 2012-10-23 | Disposition: A | Discharge status: Home / Self Care | Payer: PRIVATE HEALTH INSURANCE | Source: Ambulatory Visit | Attending: Surgery | Admitting: Surgery

## 2012-10-23 DIAGNOSIS — R1011 Right upper quadrant pain: Secondary | ICD-10-CM | POA: Insufficient documentation

## 2012-10-23 DIAGNOSIS — K8050 Calculus of bile duct without cholangitis or cholecystitis without obstruction: Secondary | ICD-10-CM

## 2012-10-23 MED ORDER — TECHNETIUM TC 99M MEBROFENIN IV KIT
5.0000 | PACK | Freq: Once | INTRAVENOUS | Status: AC | PRN
  Administered 2012-10-23: 5 via INTRAVENOUS

## 2012-10-23 MED ORDER — SINCALIDE 5 MCG IJ SOLR
INTRAMUSCULAR | Status: AC
  Administered 2012-10-23: 5 ug
  Filled 2012-10-23: qty 5

## 2012-10-29 ENCOUNTER — Other Ambulatory Visit (INDEPENDENT_AMBULATORY_CARE_PROVIDER_SITE_OTHER): Payer: Self-pay | Admitting: Surgery

## 2012-10-29 ENCOUNTER — Ambulatory Visit (INDEPENDENT_AMBULATORY_CARE_PROVIDER_SITE_OTHER): Payer: PRIVATE HEALTH INSURANCE | Admitting: Surgery

## 2012-10-29 ENCOUNTER — Encounter (INDEPENDENT_AMBULATORY_CARE_PROVIDER_SITE_OTHER): Payer: Self-pay | Admitting: Surgery

## 2012-10-29 VITALS — BP 121/76 | HR 72 | Temp 97.3°F | Resp 14 | Ht 72.0 in | Wt 240.4 lb

## 2012-10-29 DIAGNOSIS — R222 Localized swelling, mass and lump, trunk: Secondary | ICD-10-CM

## 2012-10-29 DIAGNOSIS — D179 Benign lipomatous neoplasm, unspecified: Secondary | ICD-10-CM

## 2012-10-29 DIAGNOSIS — N6089 Other benign mammary dysplasias of unspecified breast: Secondary | ICD-10-CM

## 2012-10-29 NOTE — Progress Notes (Signed)
The patient returns for Excision of the masses on the mid chest and the left chest. He also had a hiatus scan that showed a gallbladder ejection fraction 46% which is in the normal range. His symptoms have actually improved the last couple of weeks. We will observe this for now. He will let us know if his gallbladder symptoms return.  We positioned the patient on his back on the procedure room table. We prepped the skin over the mid chest with Betadine and draped sterile fashion. Anesthetized skin over the cyst with 1% lidocaine. A mid transverse incision and dissected down to the surface of the mass. We bluntly dissected around the entire sebaceous cyst and removed it entirely.  The wound was closed with 4-0 Monocryl. Steri-Strips and an occlusive dressing were applied.  We then prepped his left chest and draped in sterile fashion. Anesthetized with 1% lidocaine. A made a transverse incision over the mass. We removed a 2 cm fatty mass. This was sent for pathologic examination. The wound was closed with 4-0 Monocryl. Steri-Strips and clean dressing were applied.  We'll call patient with the pathology results. He may remove the dressings on Friday and may shower over the Steri-Strips until they come off. Followup when necessary  Wilmon Arms. Corliss Skains, MD, [facility]  10/29/2012 5:28 PM

## 2012-11-03 ENCOUNTER — Telehealth (INDEPENDENT_AMBULATORY_CARE_PROVIDER_SITE_OTHER): Payer: Self-pay | Admitting: General Surgery

## 2012-11-03 NOTE — Telephone Encounter (Signed)
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

## 2012-11-24 ENCOUNTER — Ambulatory Visit (INDEPENDENT_AMBULATORY_CARE_PROVIDER_SITE_OTHER): Payer: PRIVATE HEALTH INSURANCE | Admitting: Family Medicine

## 2012-11-24 DIAGNOSIS — Z23 Encounter for immunization: Secondary | ICD-10-CM

## 2012-11-24 DIAGNOSIS — Z299 Encounter for prophylactic measures, unspecified: Secondary | ICD-10-CM

## 2013-05-27 ENCOUNTER — Telehealth: Payer: Self-pay | Admitting: Family Medicine

## 2013-05-27 NOTE — Telephone Encounter (Signed)
Pt informed on VM that he can call Dr Elinor Dodge office to schedule visit.  If he needs a formal referral, let us know.

## 2013-05-27 NOTE — Telephone Encounter (Signed)
PT called to request a referral or information for a pediatrist, regarding an ongoing problem with his toe nail. Please assist.

## 2013-05-27 NOTE — Telephone Encounter (Signed)
I have used [facility] in past.  Could also suggest Dr Raynald Kemp with [facility].

## 2013-06-02 ENCOUNTER — Ambulatory Visit (INDEPENDENT_AMBULATORY_CARE_PROVIDER_SITE_OTHER): Payer: No Typology Code available for payment source | Admitting: Podiatry

## 2013-06-02 ENCOUNTER — Encounter: Payer: Self-pay | Admitting: Podiatry

## 2013-06-02 VITALS — BP 144/85 | HR 67

## 2013-06-02 DIAGNOSIS — L600 Ingrowing nail: Secondary | ICD-10-CM

## 2013-06-02 DIAGNOSIS — M25579 Pain in unspecified ankle and joints of unspecified foot: Secondary | ICD-10-CM

## 2013-06-02 DIAGNOSIS — M25571 Pain in right ankle and joints of right foot: Secondary | ICD-10-CM

## 2013-06-02 DIAGNOSIS — M25572 Pain in left ankle and joints of left foot: Secondary | ICD-10-CM

## 2013-06-02 NOTE — Progress Notes (Signed)
Subjective: 57 year old male accompanied by his wife, presents complaining of Ingrown nail problem that he had for over 20 years. Has had multiple ingrown nail incidents that were treated by his PCP. At this time he is ready to have them corrected. Patient was referred by Dr. Caryl Never.    Reviewed and noted medication, allergies, medical and surgical histories.   Objective: Neurovascular status are within normal. Orthopedic: Rectus foot. Derm: Symptomatic Incurvated nail at medial border on both great toes.  Pain at the nail border with pressure, but no active drainage or inflammation at the time of the visit.  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   Plan: Nail matrixectomy medial borders both great toes.   Procedures done: 1. Phenol and Alcohol Matrixectomy medial border left great toe. 2. Phenol and Alcohol Matrixectomy medial border right great toe.  Both great toes anesthetized with each 5ml of 50/50 mixture 0.5% marcaine plain and 1% Xylocaine with Epinephrine. After surgical areas were prepared with antiseptic Betadine solution, medial border nail plate was freed and resected with nail cutting forceps. Proximal matrix area was cauterized with phenol soaked cotton applicators and neutralized Alcohol. The wound was dressed with Amerigel ointment and dry sterile compression bandage.  Patient tolerated the procedures well on both great toes. Home care instructions and supplies dispensed.  Return in one week.

## 2013-06-03 DIAGNOSIS — M25579 Pain in unspecified ankle and joints of unspecified foot: Secondary | ICD-10-CM | POA: Insufficient documentation

## 2013-06-03 DIAGNOSIS — L600 Ingrowing nail: Secondary | ICD-10-CM | POA: Insufficient documentation

## 2013-06-09 ENCOUNTER — Ambulatory Visit (INDEPENDENT_AMBULATORY_CARE_PROVIDER_SITE_OTHER): Payer: No Typology Code available for payment source | Admitting: Podiatry

## 2013-06-09 DIAGNOSIS — L600 Ingrowing nail: Secondary | ICD-10-CM

## 2013-06-09 NOTE — Progress Notes (Signed)
Problem #1. S/P Bil P&A medial borders. Doing well. Nail borders are mostly dry. Patient was instructed to continue with soaking till the area is dry or no tenderness is felt.   Problem #2. Tight Achilles tendon.... Stated that he has been stretching the tendon.  Pain in left heel when barefooted.   The findings reveal  Metatasus primus elevatus bilateral. Tight Achilles tendon bilateral.  Discussed the need for Orthotic shoe inserts.  Patient will return for Orthotics.

## 2013-11-18 ENCOUNTER — Ambulatory Visit (INDEPENDENT_AMBULATORY_CARE_PROVIDER_SITE_OTHER): Payer: PRIVATE HEALTH INSURANCE

## 2013-11-18 DIAGNOSIS — Z23 Encounter for immunization: Secondary | ICD-10-CM

## 2013-12-10 ENCOUNTER — Other Ambulatory Visit (INDEPENDENT_AMBULATORY_CARE_PROVIDER_SITE_OTHER): Payer: PRIVATE HEALTH INSURANCE

## 2013-12-10 DIAGNOSIS — Z0000 Encounter for general adult medical examination without abnormal findings: Secondary | ICD-10-CM

## 2013-12-10 LAB — BASIC METABOLIC PANEL WITH GFR
BUN: 22 mg/dL (ref 6–23)
CO2: 29 meq/L (ref 19–32)
Calcium: 9.2 mg/dL (ref 8.4–10.5)
Chloride: 106 meq/L (ref 96–112)
Creatinine, Ser: 1 mg/dL (ref 0.4–1.5)
GFR: 80.25 mL/min
Glucose, Bld: 80 mg/dL (ref 70–99)
Potassium: 4 meq/L (ref 3.5–5.1)
Sodium: 140 meq/L (ref 135–145)

## 2013-12-10 LAB — POCT URINALYSIS DIPSTICK
Bilirubin, UA: NEGATIVE
Glucose, UA: NEGATIVE
Ketones, UA: NEGATIVE
Leukocytes, UA: NEGATIVE
Nitrite, UA: NEGATIVE
Protein, UA: NEGATIVE
Spec Grav, UA: 1.025
Urobilinogen, UA: 0.2
pH, UA: 6

## 2013-12-10 LAB — LIPID PANEL
Cholesterol: 236 mg/dL — ABNORMAL HIGH (ref 0–200)
HDL: 44.1 mg/dL
Total CHOL/HDL Ratio: 5
Triglycerides: 80 mg/dL (ref 0.0–149.0)
VLDL: 16 mg/dL (ref 0.0–40.0)

## 2013-12-10 LAB — LDL CHOLESTEROL, DIRECT: Direct LDL: 181.5 mg/dL

## 2013-12-10 LAB — CBC WITH DIFFERENTIAL/PLATELET
Basophils Relative: 0.6 % (ref 0.0–3.0)
Eosinophils Relative: 1.8 % (ref 0.0–5.0)
HCT: 46.5 % (ref 39.0–52.0)
Lymphs Abs: 2.1 10*3/uL (ref 0.7–4.0)
MCV: 92.1 fl (ref 78.0–100.0)
Monocytes Absolute: 0.7 10*3/uL (ref 0.1–1.0)
Platelets: 184 10*3/uL (ref 150.0–400.0)
RBC: 5.05 Mil/uL (ref 4.22–5.81)
WBC: 6.9 10*3/uL (ref 4.5–10.5)

## 2013-12-10 LAB — PSA: PSA: 0.46 ng/mL (ref 0.10–4.00)

## 2013-12-10 LAB — TSH: TSH: 0.74 u[IU]/mL (ref 0.35–5.50)

## 2013-12-10 LAB — HEPATIC FUNCTION PANEL
ALT: 31 U/L (ref 0–53)
Albumin: 4.1 g/dL (ref 3.5–5.2)
Total Protein: 6.7 g/dL (ref 6.0–8.3)

## 2013-12-17 ENCOUNTER — Ambulatory Visit (INDEPENDENT_AMBULATORY_CARE_PROVIDER_SITE_OTHER): Payer: PRIVATE HEALTH INSURANCE | Admitting: Family Medicine

## 2013-12-17 ENCOUNTER — Encounter: Payer: Self-pay | Admitting: Family Medicine

## 2013-12-17 VITALS — BP 112/70 | HR 75 | Temp 98.4°F | Ht 72.0 in | Wt 241.0 lb

## 2013-12-17 DIAGNOSIS — L918 Other hypertrophic disorders of the skin: Secondary | ICD-10-CM

## 2013-12-17 DIAGNOSIS — E669 Obesity, unspecified: Secondary | ICD-10-CM | POA: Insufficient documentation

## 2013-12-17 DIAGNOSIS — Z0000 Encounter for general adult medical examination without abnormal findings: Secondary | ICD-10-CM

## 2013-12-17 DIAGNOSIS — Z23 Encounter for immunization: Secondary | ICD-10-CM

## 2013-12-17 DIAGNOSIS — R319 Hematuria, unspecified: Secondary | ICD-10-CM

## 2013-12-17 DIAGNOSIS — L909 Atrophic disorder of skin, unspecified: Secondary | ICD-10-CM

## 2013-12-17 LAB — POCT URINALYSIS DIPSTICK
Bilirubin, UA: NEGATIVE
Glucose, UA: NEGATIVE
Leukocytes, UA: NEGATIVE
Nitrite, UA: NEGATIVE
Urobilinogen, UA: 0.2
pH, UA: 7

## 2013-12-17 NOTE — Progress Notes (Signed)
   Subjective:    Patient ID: William English, male    DOB: December 18, 1956, 57 y.o.   MRN: [id number]  HPI Here for complete physical and generally very healthy. Takes no regular medications other occasional Prilosec. Upcoming mission trip to [place]. He needs IPV booster. Other immunizations are up-to-date. He'll need malaria prevention but wishes to get this closer to time of trip.  He's had previous mild hyperlipidemia. No family history of premature CAD. Colonoscopy up to date. Nonsmoker. Exercises several days per week. No recent chest pains.  Past Medical History  Diagnosis Date  . Abdominal pain, left upper quadrant 09/02/2009   No past surgical history on file.  reports that he has never smoked. He has never used smokeless tobacco. He reports that he drinks alcohol. He reports that he does not use illicit drugs. family history includes Heart disease in his father. No Known Allergies     Review of Systems  Constitutional: Negative for fever, activity change, appetite change and fatigue.  HENT: Negative for congestion, ear pain and trouble swallowing.   Eyes: Negative for pain and visual disturbance.  Respiratory: Negative for cough, shortness of breath and wheezing.   Cardiovascular: Negative for chest pain and palpitations.  Gastrointestinal: Negative for nausea, vomiting, abdominal pain, diarrhea, constipation, blood in stool, abdominal distention and rectal pain.  Genitourinary: Negative for dysuria, hematuria and testicular pain.  Musculoskeletal: Negative for arthralgias and joint swelling.  Skin: Negative for rash.  Neurological: Negative for dizziness, syncope and headaches.  Hematological: Negative for adenopathy.  Psychiatric/Behavioral: Negative for confusion and dysphoric mood.       Objective:   Physical Exam  Constitutional: He is oriented to person, place, and time. He appears well-developed and well-nourished. No distress.  HENT:  Head: Normocephalic and atraumatic.   Right Ear: External ear normal.  Left Ear: External ear normal.  Mouth/Throat: Oropharynx is clear and moist.  Eyes: Conjunctivae and EOM are normal. Pupils are equal, round, and reactive to light.  Neck: Normal range of motion. Neck supple. No thyromegaly present.  Cardiovascular: Normal rate, regular rhythm and normal heart sounds.   No murmur heard. Pulmonary/Chest: No respiratory distress. He has no wheezes. He has no rales.  Abdominal: Soft. Bowel sounds are normal. He exhibits no distension and no mass. There is no tenderness. There is no rebound and no guarding.  Musculoskeletal: He exhibits no edema.  Lymphadenopathy:    He has no cervical adenopathy.  Neurological: He is alert and oriented to person, place, and time. He displays normal reflexes. No cranial nerve deficit.  Skin: No rash noted.  Small skin tag left axillary region.  Psychiatric: He has a normal mood and affect.          Assessment & Plan:    Complete physical. Labs reviewed. Hyperlipidemia. ATP risk calculator - ten-year risk of CAD 8%. Discussed risk and benefits of statin therapy and he wishes to work on lifestyle and weight loss first. Inactivated polio vaccine given (with anticipated trip to [place] in February). Repeat urine with trace blood on the initial dipstick. Urine Micro if positive.  We have recommended weight loss.  He currently has good exercise level and with some diet changes and calorie restriction should be successful.  Patient has small skin tag left axilla and requesting treatment with liquid nitrogen. Treated with liquid N after discussion of risks and benefits.

## 2013-12-17 NOTE — Patient Instructions (Signed)
Fat and Cholesterol Control Diet  Fat and cholesterol levels in your blood and organs are influenced by your diet. High levels of fat and cholesterol may lead to diseases of the heart, small and large blood vessels, gallbladder, liver, and pancreas.  CONTROLLING FAT AND CHOLESTEROL WITH DIET  Although exercise and lifestyle factors are important, your diet is key. That is because certain foods are known to raise cholesterol and others to lower it. The goal is to balance foods for their effect on cholesterol and more importantly, to replace saturated and trans fat with other types of fat, such as monounsaturated fat, polyunsaturated fat, and omega-3 fatty acids.  On average, a person should consume no more than 15 to 17 g of saturated fat daily. Saturated and trans fats are considered "bad" fats, and they will raise LDL cholesterol. Saturated fats are primarily found in animal products such as meats, butter, and cream. However, that does not mean you need to give up all your favorite foods. Today, there are good tasting, low-fat, low-cholesterol substitutes for most of the things you like to eat. Choose low-fat or nonfat alternatives. Choose round or loin cuts of red meat. These types of cuts are lowest in fat and cholesterol. Chicken (without the skin), fish, veal, and ground turkey breast are great choices. Eliminate fatty meats, such as hot dogs and salami. Even shellfish have little or no saturated fat. Have a 3 oz (85 g) portion when you eat lean meat, poultry, or fish.  Trans fats are also called "partially hydrogenated oils." They are oils that have been scientifically manipulated so that they are solid at room temperature resulting in a longer shelf life and improved taste and texture of foods in which they are added. Trans fats are found in stick margarine, some tub margarines, cookies, crackers, and baked goods.   When baking and cooking, oils are a great substitute for butter. The monounsaturated oils are  especially beneficial since it is believed they lower LDL and raise HDL. The oils you should avoid entirely are saturated tropical oils, such as coconut and palm.   Remember to eat a lot from food groups that are naturally free of saturated and trans fat, including fish, fruit, vegetables, beans, grains (barley, rice, couscous, bulgur wheat), and pasta (without cream sauces).   IDENTIFYING FOODS THAT LOWER FAT AND CHOLESTEROL   Soluble fiber may lower your cholesterol. This type of fiber is found in fruits such as apples, vegetables such as broccoli, potatoes, and carrots, legumes such as beans, peas, and lentils, and grains such as barley. Foods fortified with plant sterols (phytosterol) may also lower cholesterol. You should eat at least 2 g per day of these foods for a cholesterol lowering effect.   Read package labels to identify low-saturated fats, trans fat free, and low-fat foods at the supermarket. Select cheeses that have only 2 to 3 g saturated fat per ounce. Use a heart-healthy tub margarine that is free of trans fats or partially hydrogenated oil. When buying baked goods (cookies, crackers), avoid partially hydrogenated oils. Breads and muffins should be made from whole grains (whole-wheat or whole oat flour, instead of "flour" or "enriched flour"). Buy non-creamy canned soups with reduced salt and no added fats.   FOOD PREPARATION TECHNIQUES   Never deep-fry. If you must fry, either stir-fry, which uses very little fat, or use non-stick cooking sprays. When possible, broil, bake, or roast meats, and steam vegetables. Instead of putting butter or margarine on vegetables, use lemon   and herbs, applesauce, and cinnamon (for squash and sweet potatoes). Use nonfat yogurt, salsa, and low-fat dressings for salads.   LOW-SATURATED FAT / LOW-FAT FOOD SUBSTITUTES  Meats / Saturated Fat (g)  · Avoid: Steak, marbled (3 oz/85 g) / 11 g  · Choose: Steak, lean (3 oz/85 g) / 4 g  · Avoid: Hamburger (3 oz/85 g) / 7  g  · Choose: Hamburger, lean (3 oz/85 g) / 5 g  · Avoid: Ham (3 oz/85 g) / 6 g  · Choose: Ham, lean cut (3 oz/85 g) / 2.4 g  · Avoid: Chicken, with skin, dark meat (3 oz/85 g) / 4 g  · Choose: Chicken, skin removed, dark meat (3 oz/85 g) / 2 g  · Avoid: Chicken, with skin, light meat (3 oz/85 g) / 2.5 g  · Choose: Chicken, skin removed, light meat (3 oz/85 g) / 1 g  Dairy / Saturated Fat (g)  · Avoid: Whole milk (1 cup) / 5 g  · Choose: Low-fat milk, 2% (1 cup) / 3 g  · Choose: Low-fat milk, 1% (1 cup) / 1.5 g  · Choose: Skim milk (1 cup) / 0.3 g  · Avoid: Hard cheese (1 oz/28 g) / 6 g  · Choose: Skim milk cheese (1 oz/28 g) / 2 to 3 g  · Avoid: Cottage cheese, 4% fat (1 cup) / 6.5 g  · Choose: Low-fat cottage cheese, 1% fat (1 cup) / 1.5 g  · Avoid: Ice cream (1 cup) / 9 g  · Choose: Sherbet (1 cup) / 2.5 g  · Choose: Nonfat frozen yogurt (1 cup) / 0.3 g  · Choose: Frozen fruit bar / trace  · Avoid: Whipped cream (1 tbs) / 3.5 g  · Choose: Nondairy whipped topping (1 tbs) / 1 g  Condiments / Saturated Fat (g)  · Avoid: Mayonnaise (1 tbs) / 2 g  · Choose: Low-fat mayonnaise (1 tbs) / 1 g  · Avoid: Butter (1 tbs) / 7 g  · Choose: Extra light margarine (1 tbs) / 1 g  · Avoid: Coconut oil (1 tbs) / 11.8 g  · Choose: Olive oil (1 tbs) / 1.8 g  · Choose: Corn oil (1 tbs) / 1.7 g  · Choose: Safflower oil (1 tbs) / 1.2 g  · Choose: Sunflower oil (1 tbs) / 1.4 g  · Choose: Soybean oil (1 tbs) / 2.4 g  · Choose: Canola oil (1 tbs) / 1 g  Document Released: 12/17/2005 Document Revised: 04/13/2013 Document Reviewed: 06/07/2011  [employer]® Patient Information ©2014 [employer].

## 2013-12-17 NOTE — Progress Notes (Signed)
Pre visit review using our clinic review tool, if applicable. No additional management support is needed unless otherwise documented below in the visit note. 

## 2013-12-18 LAB — URINALYSIS, MICROSCOPIC ONLY

## 2013-12-22 ENCOUNTER — Other Ambulatory Visit: Payer: Self-pay | Admitting: Family Medicine

## 2013-12-22 DIAGNOSIS — R319 Hematuria, unspecified: Secondary | ICD-10-CM

## 2014-01-01 ENCOUNTER — Other Ambulatory Visit (INDEPENDENT_AMBULATORY_CARE_PROVIDER_SITE_OTHER): Payer: PRIVATE HEALTH INSURANCE

## 2014-01-01 ENCOUNTER — Other Ambulatory Visit: Payer: Self-pay | Admitting: Family Medicine

## 2014-01-01 DIAGNOSIS — R319 Hematuria, unspecified: Secondary | ICD-10-CM

## 2014-01-01 LAB — POCT URINALYSIS DIPSTICK
BILIRUBIN UA: NEGATIVE
Blood, UA: NEGATIVE
Glucose, UA: NEGATIVE
Ketones, UA: NEGATIVE
LEUKOCYTES UA: NEGATIVE
NITRITE UA: NEGATIVE
PH UA: 7.5
PROTEIN UA: NEGATIVE
Spec Grav, UA: 1.015
Urobilinogen, UA: 0.2

## 2014-01-05 ENCOUNTER — Other Ambulatory Visit: Payer: Self-pay | Admitting: Family Medicine

## 2014-01-05 DIAGNOSIS — R319 Hematuria, unspecified: Secondary | ICD-10-CM

## 2014-02-09 ENCOUNTER — Telehealth: Payer: Self-pay | Admitting: Family Medicine

## 2014-02-09 NOTE — Telephone Encounter (Signed)
Pt is going to [place] on 02-22-14 and will be there for about 3 wks. Pt is requesiting cipro and ?malaria pills. [facility] [address] Pt would like md to call him.

## 2014-02-09 NOTE — Telephone Encounter (Signed)
Refill Cipro 500 mg one po bid for 3 days prn travelers diarrhea  #20 Malarone 250 mg one daily starting one day prior to travel, during travel, and for one week after return.  #20

## 2014-02-10 MED ORDER — ATOVAQUONE-PROGUANIL HCL 250-100 MG PO TABS: ORAL_TABLET | ORAL | Status: DC

## 2014-02-10 MED ORDER — CIPROFLOXACIN HCL 500 MG PO TABS: 500.0000 mg | ORAL_TABLET | Freq: Two times a day (BID) | ORAL | Status: DC

## 2014-02-10 NOTE — Telephone Encounter (Signed)
RXs sent to pharmacy and pt aware

## 2014-02-11 ENCOUNTER — Telehealth: Payer: Self-pay | Admitting: Family Medicine

## 2014-02-11 NOTE — Telephone Encounter (Signed)
No

## 2014-02-11 NOTE — Telephone Encounter (Signed)
Pt informed

## 2014-02-11 NOTE — Telephone Encounter (Signed)
I received a fax denying Atovaquone-Proguanil.  Pt's plan excludes coverage of travel medications.

## 2014-02-11 NOTE — Telephone Encounter (Signed)
Let pt know.  Sounds like they wont cover any travel medications?  If there is alternative they cover let me know.

## 2014-02-11 NOTE — Telephone Encounter (Signed)
Did they give any alternative medications?

## 2014-02-18 ENCOUNTER — Telehealth: Payer: Self-pay | Admitting: Family Medicine

## 2014-02-18 NOTE — Telephone Encounter (Addendum)
error 

## 2014-02-18 NOTE — Telephone Encounter (Signed)
Pt is leaving for [place] on Monday. Was reviewing info and CDC recommending typhoid prior to entering [place].  pls advise

## 2014-02-18 NOTE — Telephone Encounter (Signed)
Pt informed

## 2014-02-18 NOTE — Telephone Encounter (Signed)
He had oral typhoid vaccine in 2013 and this is good for 5 years.

## 2014-03-31 ENCOUNTER — Other Ambulatory Visit (INDEPENDENT_AMBULATORY_CARE_PROVIDER_SITE_OTHER): Payer: PRIVATE HEALTH INSURANCE

## 2014-03-31 DIAGNOSIS — R319 Hematuria, unspecified: Secondary | ICD-10-CM

## 2014-03-31 LAB — POCT URINALYSIS DIPSTICK
Bilirubin, UA: NEGATIVE
Glucose, UA: NEGATIVE
Ketones, UA: NEGATIVE
Leukocytes, UA: NEGATIVE
NITRITE UA: NEGATIVE
PH UA: 6.5
Protein, UA: NEGATIVE
Spec Grav, UA: 1.02
UROBILINOGEN UA: 0.2

## 2014-04-13 ENCOUNTER — Telehealth: Payer: Self-pay | Admitting: Family Medicine

## 2014-04-13 NOTE — Telephone Encounter (Signed)
Pt requesting results of ua labs done on 03/31/14. Please call pt back at earliest conveience.  Thanks

## 2014-04-14 ENCOUNTER — Telehealth: Payer: Self-pay | Admitting: *Deleted

## 2014-04-14 LAB — URINALYSIS, MICROSCOPIC ONLY

## 2014-04-14 NOTE — Telephone Encounter (Signed)
Left patient a message. He needs to come and leave a urine sample per Dr. Elease Hashimoto. It's for a urine microscopic.

## 2014-04-14 NOTE — Telephone Encounter (Signed)
Please go ahead and get urine micro.

## 2014-04-14 NOTE — Telephone Encounter (Signed)
Spoke with William English the urine was sent to the lab and the lab did not run the urine. Stated they did not see a order to do a urine micro.Marland Kitchen

## 2014-04-14 NOTE — Telephone Encounter (Signed)
Pt is coming back in about 2:30 for urine test, per your note

## 2014-04-14 NOTE — Telephone Encounter (Signed)
William English is going to have pt come back in for a urine micro

## 2014-06-28 IMAGING — NM NM HEPATO W/GB/PHARM/[PERSON_NAME]
3 series · 13 of 13 positions shown · non-contrast
Comparison: none

CLINICAL DATA: Right upper abdominal pain.  Negative ultrasound.

[he hepatobiliary · 3.43mm/px · 6 of 60 frames shown (1 of 3)]
[frame 6/60]
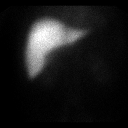
[frame 16/60]
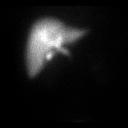
[frame 26/60]
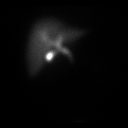
[frame 36/60]
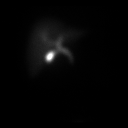
[frame 46/60]
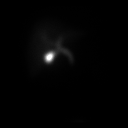
[frame 56/60]
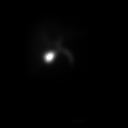

[he hepatobiliary · 3.43mm/px · 6 of 30 frames shown (2 of 3)]
[frame 3/30]
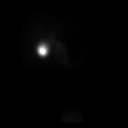
[frame 8/30]
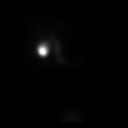
[frame 13/30]
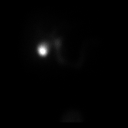
[frame 18/30]
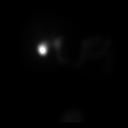
[frame 23/30]
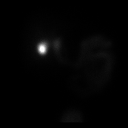
[frame 28/30]
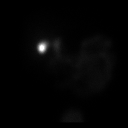

[he hepatobiliary · 1 of 1 slices shown (3 of 3)]
[im 1/1]
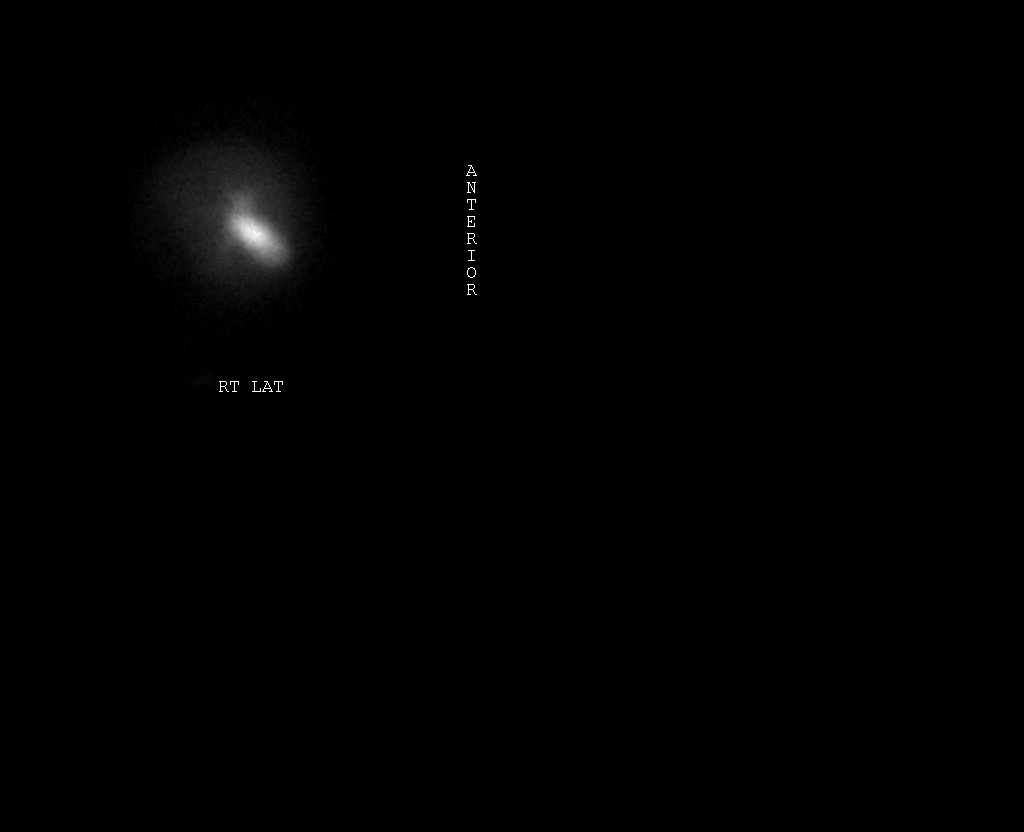

[13 of 13 positions shown; findings below may reference images not displayed]

HEPATOBILIARY SCINTIGRAPHY WITH EJECTION FRACTION

Anterior imaging afterW.Nm8i Ic44T Choletec IV. There is prompt
clearance of the radiopharmaceutical from the blood pool. Timely
visualization of activity in central bile ducts, small bowel, and
gallbladder.
After 1 hour,2.11 mcg CCK was infused intravenously and imaging
continued.  The calculated gallbladder ejection fraction over 30
minutes is 46.6% [(normal >30% at  thirty minutes (Ziessman et al.,
6440 J. Nuclear Med).]

IMPRESSION
1. Patency of cystic and common bile ducts.
2. Normal gallbladder ejection fraction.

## 2015-04-21 ENCOUNTER — Ambulatory Visit (INDEPENDENT_AMBULATORY_CARE_PROVIDER_SITE_OTHER): Payer: No Typology Code available for payment source | Admitting: Family Medicine

## 2015-04-21 ENCOUNTER — Encounter: Payer: Self-pay | Admitting: Family Medicine

## 2015-04-21 VITALS — BP 120/84 | HR 90 | Temp 98.0°F | Ht 72.0 in | Wt 245.3 lb

## 2015-04-21 DIAGNOSIS — R05 Cough: Secondary | ICD-10-CM | POA: Diagnosis not present

## 2015-04-21 DIAGNOSIS — R059 Cough, unspecified: Secondary | ICD-10-CM

## 2015-04-21 MED ORDER — AZITHROMYCIN 250 MG PO TABS: ORAL_TABLET | ORAL | Status: AC

## 2015-04-21 NOTE — Progress Notes (Signed)
Pre visit review using our clinic review tool, if applicable. No additional management support is needed unless otherwise documented below in the visit note. 

## 2015-04-21 NOTE — Progress Notes (Signed)
   Subjective:    Patient ID: William English, male    DOB: 1956-04-18, 59 y.o.   MRN: [id number]  HPI Patient seen with about one week history of Restoril loss. He has had cough productive of thick green sputum and sore throat. He feels he may of had some low-grade fever around 100 last night. No dyspnea. Increased malaise. Sore throat is been severe at times. No nausea, vomiting, or diarrhea. No skin rashes. His wife just recently finished up treatments for breast cancer.  Past Medical History  Diagnosis Date  . Abdominal pain, left upper quadrant 09/02/2009   No past surgical history on file.  reports that he has never smoked. He has never used smokeless tobacco. He reports that he drinks alcohol. He reports that he does not use illicit drugs. family history includes Heart disease in his father. No Known Allergies    Review of Systems  Constitutional: Positive for fever and fatigue.  HENT: Positive for congestion and sore throat.   Respiratory: Positive for cough.        Objective:   Physical Exam  Constitutional: He appears well-developed and well-nourished.  HENT:  Right Ear: External ear normal.  Left Ear: External ear normal.  Oropharynx reveals some erythema with question minimal whitish exudate left tonsil region.  Neck: Neck supple.  Cardiovascular: Normal rate and regular rhythm.   Pulmonary/Chest: Effort normal and breath sounds normal. No respiratory distress. He has no wheezes. He has no rales.  Lymphadenopathy:    He has no cervical adenopathy.  Skin: No rash noted.          Assessment & Plan:  Upper respiratory illness with productive cough and sore throat. Progressive symptoms and possible low-grade fever last night. Start Zithromax. Follow-up promptly for increasing fever or worsening symptoms

## 2015-04-21 NOTE — Patient Instructions (Signed)

## 2015-05-12 ENCOUNTER — Other Ambulatory Visit (INDEPENDENT_AMBULATORY_CARE_PROVIDER_SITE_OTHER): Payer: No Typology Code available for payment source

## 2015-05-12 DIAGNOSIS — Z0000 Encounter for general adult medical examination without abnormal findings: Secondary | ICD-10-CM | POA: Diagnosis not present

## 2015-05-12 LAB — COMPREHENSIVE METABOLIC PANEL
ALK PHOS: 68 U/L (ref 39–117)
ALT: 21 U/L (ref 0–53)
AST: 19 U/L (ref 0–37)
Albumin: 4.1 g/dL (ref 3.5–5.2)
BILIRUBIN TOTAL: 0.8 mg/dL (ref 0.2–1.2)
BUN: 20 mg/dL (ref 6–23)
CO2: 29 mEq/L (ref 19–32)
Calcium: 9.5 mg/dL (ref 8.4–10.5)
Chloride: 104 mEq/L (ref 96–112)
Creatinine, Ser: 1.01 mg/dL (ref 0.40–1.50)
GFR: 80.76 mL/min (ref >60.00)
GLUCOSE: 76 mg/dL (ref 70–99)
Potassium: 4.1 mEq/L (ref 3.5–5.1)
Sodium: 140 mEq/L (ref 135–145)
Total Protein: 6.7 g/dL (ref 6.0–8.3)

## 2015-05-12 LAB — CBC WITH DIFFERENTIAL/PLATELET
BASOS ABS: 0 10*3/uL (ref 0.0–0.1)
Basophils Relative: 0.6 % (ref 0.0–3.0)
Eosinophils Absolute: 0.1 10*3/uL (ref 0.0–0.7)
Eosinophils Relative: 1.8 % (ref 0.0–5.0)
HCT: 46.8 % (ref 39.0–52.0)
Hemoglobin: 16 g/dL (ref 13.0–17.0)
LYMPHS ABS: 2.2 10*3/uL (ref 0.7–4.0)
LYMPHS PCT: 28.4 % (ref 12.0–46.0)
MCHC: 34.2 g/dL (ref 30.0–36.0)
MCV: 90.8 fl (ref 78.0–100.0)
MONOS PCT: 10.6 % (ref 3.0–12.0)
Monocytes Absolute: 0.8 10*3/uL (ref 0.1–1.0)
Neutro Abs: 4.4 10*3/uL (ref 1.4–7.7)
Neutrophils Relative %: 58.6 % (ref 43.0–77.0)
PLATELETS: 217 10*3/uL (ref 150.0–400.0)
RBC: 5.16 Mil/uL (ref 4.22–5.81)
RDW: 12.6 % (ref 11.5–15.5)
WBC: 7.6 10*3/uL (ref 4.0–10.5)

## 2015-05-12 LAB — LIPID PANEL
CHOLESTEROL: 226 mg/dL — AB (ref 0–200)
HDL: 35.9 mg/dL — ABNORMAL LOW (ref >39.00)
LDL CALC: 164 mg/dL — AB (ref 0–99)
NONHDL: 190.1
Total CHOL/HDL Ratio: 6
Triglycerides: 131 mg/dL (ref 0.0–149.0)
VLDL: 26.2 mg/dL (ref 0.0–40.0)

## 2015-05-12 LAB — PSA: PSA: 0.56 ng/mL (ref 0.10–4.00)

## 2015-05-12 LAB — TSH: TSH: 0.82 u[IU]/mL (ref 0.35–4.50)

## 2015-05-19 ENCOUNTER — Encounter: Payer: Self-pay | Admitting: Family Medicine

## 2015-05-19 ENCOUNTER — Ambulatory Visit (INDEPENDENT_AMBULATORY_CARE_PROVIDER_SITE_OTHER): Payer: No Typology Code available for payment source | Admitting: Family Medicine

## 2015-05-19 VITALS — Ht 72.0 in

## 2015-05-19 DIAGNOSIS — E785 Hyperlipidemia, unspecified: Secondary | ICD-10-CM

## 2015-05-19 DIAGNOSIS — Z0000 Encounter for general adult medical examination without abnormal findings: Secondary | ICD-10-CM

## 2015-05-19 NOTE — Progress Notes (Signed)
   Subjective:    Patient ID: William English, male    DOB: [birth year], 59 y.o.   MRN: [id number]  HPI   Patient seen for complete physical. Generally very healthy. Takes as needed omeprazole for reflux. Exercises about 5 days per week. Nonsmoker. Tetanus up-to-date. Colonoscopy 2012. He has no specific complaints today other than lingering cough from a few weeks ago. No hemoptysis. No dyspnea. No fevers or chills.  Past Medical History  Diagnosis Date  . Abdominal pain, left upper quadrant 09/02/2009   No past surgical history on file.  reports that he has never smoked. He has never used smokeless tobacco. He reports that he drinks alcohol. He reports that he does not use illicit drugs. family history includes Heart disease in his father. No Known Allergies]   Review of Systems  Constitutional: Negative for fever, activity change, appetite change and fatigue.  HENT: Negative for congestion, ear pain and trouble swallowing.   Eyes: Negative for pain and visual disturbance.  Respiratory: Positive for cough. Negative for shortness of breath and wheezing.   Cardiovascular: Negative for chest pain and palpitations.  Gastrointestinal: Negative for nausea, vomiting, abdominal pain, diarrhea, constipation, blood in stool, abdominal distention and rectal pain.  Genitourinary: Negative for dysuria, hematuria and testicular pain.  Musculoskeletal: Negative for joint swelling and arthralgias.  Skin: Negative for rash.  Neurological: Negative for dizziness, syncope and headaches.  Hematological: Negative for adenopathy.  Psychiatric/Behavioral: Negative for confusion and dysphoric mood.       Objective:   Physical Exam  Constitutional: He is oriented to person, place, and time. He appears well-developed and well-nourished. No distress.  HENT:  Head: Normocephalic and atraumatic.  Right Ear: External ear normal.  Left Ear: External ear normal.  Mouth/Throat: Oropharynx is clear and moist.  Eyes:  Conjunctivae and EOM are normal. Pupils are equal, round, and reactive to light.  Neck: Normal range of motion. Neck supple. No thyromegaly present.  Cardiovascular: Normal rate, regular rhythm and normal heart sounds.   No murmur heard. Pulmonary/Chest: No respiratory distress. He has no wheezes. He has no rales.  Abdominal: Soft. Bowel sounds are normal. He exhibits no distension and no mass. There is no tenderness. There is no rebound and no guarding.  Musculoskeletal: He exhibits no edema.  Lymphadenopathy:    He has no cervical adenopathy.  Neurological: He is alert and oriented to person, place, and time. He displays normal reflexes. No cranial nerve deficit.  Skin: No rash noted.  Psychiatric: He has a normal mood and affect.          Assessment & Plan:  Complete physical. Labs reviewed. No major concerns other than the fact that he has some dyslipidemia. His HDL has dropped and cholesterol is slightly elevated. 10 year risk of CAD event 12%. We discussed possible lipid therapy with statins and at this point he is not interested. Consider follow-up fasting lipid panel 6 months.

## 2015-05-19 NOTE — Progress Notes (Signed)
Pre visit review using our clinic review tool, if applicable. No additional management support is needed unless otherwise documented below in the visit note. 

## 2015-12-16 ENCOUNTER — Ambulatory Visit (INDEPENDENT_AMBULATORY_CARE_PROVIDER_SITE_OTHER): Payer: 59 | Admitting: Family Medicine

## 2015-12-16 ENCOUNTER — Encounter: Payer: Self-pay | Admitting: Family Medicine

## 2015-12-16 VITALS — BP 102/70 | HR 82 | Temp 97.7°F | Resp 16 | Ht 72.0 in | Wt 248.6 lb

## 2015-12-16 DIAGNOSIS — M5412 Radiculopathy, cervical region: Secondary | ICD-10-CM | POA: Diagnosis not present

## 2015-12-16 MED ORDER — PREDNISONE 10 MG PO TABS: ORAL_TABLET | ORAL | Status: DC

## 2015-12-16 NOTE — Patient Instructions (Signed)

## 2015-12-16 NOTE — Progress Notes (Signed)
Pre visit review using our clinic review tool, if applicable. No additional management support is needed unless otherwise documented below in the visit note. 

## 2015-12-16 NOTE — Progress Notes (Signed)
   Subjective:    Patient ID: William English, male    DOB: March 14, 1956, 59 y.o.   MRN: [id number]  HPI Patient seen with intermittent right upper extremity numbness Also has some associated right upper back and neck pains Pains are relatively minor compared with numbness. He thinks he may have some mild weakness intermittently He has particularly noticed numbness involving his thumb and occasionally index finger. He notes that with certain positions his arm tends to go numb and with change of neck position such as lateral bending and rotation sometimes becomes numb. No right lower extremity symptoms. Most recent MRI cervical spine 2008 which showed degenerative changes at several levels with bulging disc. He has never had neck surgery. Denies recent injury.  Past Medical History  Diagnosis Date  . Abdominal pain, left upper quadrant 09/02/2009   No past surgical history on file.  reports that he has never smoked. He has never used smokeless tobacco. He reports that he drinks alcohol. He reports that he does not use illicit drugs. family history includes Heart disease in his father. No Known Allergies    Review of Systems  Respiratory: Negative for shortness of breath.   Cardiovascular: Negative for chest pain.  Neurological: Positive for weakness and numbness. Negative for dizziness and headaches.       Objective:   Physical Exam  Constitutional: He is oriented to person, place, and time. He appears well-developed and well-nourished.  Cardiovascular: Normal rate and regular rhythm.   Pulmonary/Chest: Effort normal and breath sounds normal. No respiratory distress. He has no wheezes. He has no rales.  Musculoskeletal:  Slightly limited range of motion with rotation and lateral bending to the right and left side.  Neurological: He is alert and oriented to person, place, and time. No cranial nerve deficit.  Full strength upper extremities. Trace reflux biceps and brachioradialis  bilaterally. Normal sensory function to touch          Assessment & Plan:  Right cervical radiculopathy. Try prednisone taper. Reviewed potential side effects. Consider MRI cervical spine if not better in 2 weeks.  Follow-up immediately for any rapidly progressive weakness or other changes

## 2016-10-08 ENCOUNTER — Ambulatory Visit (INDEPENDENT_AMBULATORY_CARE_PROVIDER_SITE_OTHER): Payer: 59 | Admitting: Family Medicine

## 2016-10-08 ENCOUNTER — Encounter: Payer: Self-pay | Admitting: Family Medicine

## 2016-10-08 VITALS — BP 126/90 | HR 64 | Temp 98.1°F | Ht 72.0 in | Wt 242.0 lb

## 2016-10-08 DIAGNOSIS — R202 Paresthesia of skin: Secondary | ICD-10-CM

## 2016-10-08 DIAGNOSIS — Z23 Encounter for immunization: Secondary | ICD-10-CM

## 2016-10-08 DIAGNOSIS — R0789 Other chest pain: Secondary | ICD-10-CM

## 2016-10-08 NOTE — Patient Instructions (Signed)
Try some Prilosec 40 mg daily and take prior to dinner.   Let me know if symptoms not improving over the next few weeks. Follow up immediately for any exercise associated chest pain or shortness of breath.

## 2016-10-08 NOTE — Progress Notes (Signed)
Subjective:     Patient ID: William English, male   DOB: [birth year], 60 y.o.   MRN: [id number]  HPI Patient seen for the following concerns.  About 3 week history of frequent burping. He denies any classic reflux symptoms. No dysphagia. He's had some intermittent episodes of left-sided chest pressure but these never occur exercising. In fact, he states his symptoms tend to be better when he exercises. No associated dyspnea. He has some radiation of discomfort toward the back. No family history of premature CAD. Nonsmoker. No diabetes history. No appetite or weight changes.  He states both parents had atrial fibrillation in the past. He also is sometimes aware of irregular pulse especially at night. No associated dizziness.  No clear exacerbating factors.  Occasionally has mild numbness left side of chin and left side of face. No weakness. No speech changes. No extremity weakness. Brother was recently diagnosed with MS and he has concerns because of family history  Past Medical History:  Diagnosis Date  . Abdominal pain, left upper quadrant 09/02/2009   No past surgical history on file.  reports that he has never smoked. He has never used smokeless tobacco. He reports that he drinks alcohol. He reports that he does not use drugs. family history includes Heart disease in his father. No Known Allergies   Review of Systems  Constitutional: Negative for appetite change, chills, fatigue, fever and unexpected weight change.  Eyes: Negative for visual disturbance.  Respiratory: Negative for cough and shortness of breath.   Cardiovascular: Positive for chest pain and palpitations.  Gastrointestinal: Negative for abdominal pain.  Neurological: Negative for dizziness, tremors, seizures, syncope, speech difficulty, weakness and headaches.  Hematological: Negative for adenopathy.       Objective:   Physical Exam  Constitutional: He is oriented to person, place, and time. He appears well-developed and  well-nourished.  Eyes: Pupils are equal, round, and reactive to light.  Neck: Neck supple. No thyromegaly present.  Cardiovascular: Normal rate and regular rhythm.  Exam reveals no gallop.   No murmur heard. Pulmonary/Chest: Effort normal and breath sounds normal. No respiratory distress. He has no wheezes. He has no rales.  Abdominal: Soft. There is no tenderness.  Musculoskeletal: He exhibits no edema.  Neurological: He is alert and oriented to person, place, and time. No cranial nerve deficit. Coordination normal.       Assessment:     #1 atypical chest symptoms. Reassuring is the fact that he has not had any symptoms associated with exercise.  ?GERD related.  #2 transient paresthesias left side of face.  No associated weakness.     Plan:     -Obtain EKG-NSR with no acute changes. -trial of OTC Prilosec 40 mg qd -set up CPE -touch base in 2-3 weeks if symptoms not improved with the above. -follow up immediately for any exertional symptoms. -Consider MRI brain if facial symptoms persist.    Eulas Post MD [facility] at [facility]

## 2016-10-08 NOTE — Progress Notes (Signed)
Pre visit review using our clinic review tool, if applicable. No additional management support is needed unless otherwise documented below in the visit note. 

## 2016-10-11 ENCOUNTER — Ambulatory Visit: Payer: 59 | Admitting: Podiatry

## 2016-10-17 ENCOUNTER — Encounter: Payer: Self-pay | Admitting: Family Medicine

## 2016-10-17 ENCOUNTER — Ambulatory Visit (INDEPENDENT_AMBULATORY_CARE_PROVIDER_SITE_OTHER): Payer: 59 | Admitting: Family Medicine

## 2016-10-17 VITALS — BP 120/86 | HR 92 | Temp 98.0°F | Ht 72.0 in | Wt 242.0 lb

## 2016-10-17 DIAGNOSIS — R0789 Other chest pain: Secondary | ICD-10-CM

## 2016-10-17 DIAGNOSIS — R200 Anesthesia of skin: Secondary | ICD-10-CM

## 2016-10-17 NOTE — Progress Notes (Signed)
Pre visit review using our clinic review tool, if applicable. No additional management support is needed unless otherwise documented below in the visit note. 

## 2016-10-17 NOTE — Patient Instructions (Signed)
We will set up stress test

## 2016-10-17 NOTE — Progress Notes (Signed)
Subjective:     Patient ID: William English, male   DOB: March 21, 1956, 60 y.o.   MRN: [id number]  HPI Patient is seen accompanied by wife with some continued vague symptoms of chest pressure and dyspepsia. He tried Prilosec without improvement. He walks and jogs on treadmill almost daily and quite vigorously. Usually alternates brisk walking with running at incline has had no chest discomfort whatsoever with exercise. He denies any dyspnea.  His chest symptoms usually improve with exercise. No cardiac history. Both parents had atrial fibrillation but no family history of premature CAD.  Denies any recent GERD symptoms. He has frequent burping and sensation of needing to belch. No dysphagia. No appetite or weight changes. No cough. No pleuritic pain. Wife thinks he's  had some general malaise recently. He does have upcoming physical scheduled along with labs  Patient also has some intermittent symptoms of left facial numbness. Started off left cheek and chin region but now somewhat bilateral. He had concerns because brother was diagnosed with MS recently. Patient not had any dizziness or any visual changes or other focal neurologic symptoms. No speech changes and no facial weakness.  He is a nonsmoker. No history of hypertension or diabetes.  Past Medical History:  Diagnosis Date  . Abdominal pain, left upper quadrant 09/02/2009   No past surgical history on file.  reports that he has never smoked. He has never used smokeless tobacco. He reports that he drinks alcohol. He reports that he does not use drugs. family history includes Heart disease in his father. No Known Allergies   Review of Systems  Constitutional: Negative for fatigue.  Eyes: Negative for visual disturbance.  Respiratory: Negative for cough, chest tightness, shortness of breath and wheezing.   Cardiovascular: Negative for palpitations and leg swelling.  Gastrointestinal: Negative for constipation, diarrhea, nausea and vomiting.   Neurological: Negative for dizziness, syncope, weakness, light-headedness and headaches.       Objective:   Physical Exam  Constitutional: He is oriented to person, place, and time. He appears well-developed and well-nourished.  Neck: Neck supple.  Cardiovascular: Normal rate and regular rhythm.  Exam reveals no gallop and no friction rub.   No murmur heard. Pulmonary/Chest: Effort normal and breath sounds normal. No respiratory distress. He has no wheezes. He has no rales.  Musculoskeletal: He exhibits no edema.  Lymphadenopathy:    He has no cervical adenopathy.  Neurological: He is alert and oriented to person, place, and time. No cranial nerve deficit.       Assessment:     Atypical chest symptoms. He does describe occasional intermittent left chest pressure but never with activity and exercises fairly vigorously.  Recent EKG unremarkable.    Intermittent paresthesias mostly involving left side of face without any associated weakness    Plan:     -Set up nuclear stress test to further evaluate chest symptoms. -If normal, consider further GI evaluation.  Eulas Post MD [facility] at [facility]

## 2016-10-22 ENCOUNTER — Other Ambulatory Visit (INDEPENDENT_AMBULATORY_CARE_PROVIDER_SITE_OTHER): Payer: 59

## 2016-10-22 DIAGNOSIS — Z0000 Encounter for general adult medical examination without abnormal findings: Secondary | ICD-10-CM

## 2016-10-22 LAB — CBC WITH DIFFERENTIAL/PLATELET
BASOS PCT: 0.3 % (ref 0.0–3.0)
Basophils Absolute: 0 10*3/uL (ref 0.0–0.1)
EOS ABS: 0.1 10*3/uL (ref 0.0–0.7)
EOS PCT: 1.5 % (ref 0.0–5.0)
HEMATOCRIT: 45 % (ref 39.0–52.0)
HEMOGLOBIN: 15 g/dL (ref 13.0–17.0)
LYMPHS PCT: 33.7 % (ref 12.0–46.0)
Lymphs Abs: 2.2 10*3/uL (ref 0.7–4.0)
MCHC: 33.3 g/dL (ref 30.0–36.0)
MCV: 91.8 fl (ref 78.0–100.0)
Monocytes Absolute: 0.7 10*3/uL (ref 0.1–1.0)
Monocytes Relative: 10.8 % (ref 3.0–12.0)
Neutro Abs: 3.4 10*3/uL (ref 1.4–7.7)
Neutrophils Relative %: 53.7 % (ref 43.0–77.0)
Platelets: 188 10*3/uL (ref 150.0–400.0)
RBC: 4.9 Mil/uL (ref 4.22–5.81)
RDW: 13.3 % (ref 11.5–15.5)
WBC: 6.4 10*3/uL (ref 4.0–10.5)

## 2016-10-22 LAB — BASIC METABOLIC PANEL
BUN: 17 mg/dL (ref 6–23)
CHLORIDE: 105 meq/L (ref 96–112)
CO2: 30 mEq/L (ref 19–32)
CREATININE: 0.97 mg/dL (ref 0.40–1.50)
Calcium: 9.4 mg/dL (ref 8.4–10.5)
GFR: 84.19 mL/min (ref >60.00)
Glucose, Bld: 86 mg/dL (ref 70–99)
POTASSIUM: 4.2 meq/L (ref 3.5–5.1)
Sodium: 140 mEq/L (ref 135–145)

## 2016-10-22 LAB — LIPID PANEL
CHOL/HDL RATIO: 6
Cholesterol: 212 mg/dL — ABNORMAL HIGH (ref 0–200)
HDL: 36.2 mg/dL — ABNORMAL LOW (ref >39.00)
LDL Cholesterol: 146 mg/dL — ABNORMAL HIGH (ref 0–99)
NONHDL: 175.47
TRIGLYCERIDES: 149 mg/dL (ref 0.0–149.0)
VLDL: 29.8 mg/dL (ref 0.0–40.0)

## 2016-10-22 LAB — HEPATIC FUNCTION PANEL
ALK PHOS: 60 U/L (ref 39–117)
ALT: 24 U/L (ref 0–53)
AST: 29 U/L (ref 0–37)
Albumin: 4.2 g/dL (ref 3.5–5.2)
BILIRUBIN DIRECT: 0.1 mg/dL (ref 0.0–0.3)
TOTAL PROTEIN: 6.4 g/dL (ref 6.0–8.3)
Total Bilirubin: 0.8 mg/dL (ref 0.2–1.2)

## 2016-10-22 LAB — TSH: TSH: 0.76 u[IU]/mL (ref 0.35–4.50)

## 2016-10-22 LAB — PSA: PSA: 0.53 ng/mL (ref 0.10–4.00)

## 2016-10-29 ENCOUNTER — Encounter: Payer: Self-pay | Admitting: Family Medicine

## 2016-10-29 ENCOUNTER — Ambulatory Visit (INDEPENDENT_AMBULATORY_CARE_PROVIDER_SITE_OTHER): Payer: 59 | Admitting: Family Medicine

## 2016-10-29 VITALS — BP 120/90 | HR 64 | Temp 97.4°F | Ht 71.25 in | Wt 239.0 lb

## 2016-10-29 DIAGNOSIS — Z0000 Encounter for general adult medical examination without abnormal findings: Secondary | ICD-10-CM

## 2016-10-29 DIAGNOSIS — M722 Plantar fascial fibromatosis: Secondary | ICD-10-CM

## 2016-10-29 MED ORDER — METHYLPREDNISOLONE ACETATE 80 MG/ML IJ SUSP
80.0000 mg | Freq: Once | INTRAMUSCULAR | Status: AC
  Administered 2016-10-29: 80 mg via INTRA_ARTICULAR

## 2016-10-29 NOTE — Progress Notes (Signed)
Pre visit review using our clinic review tool, if applicable. No additional management support is needed unless otherwise documented below in the visit note. 

## 2016-10-29 NOTE — Progress Notes (Signed)
Subjective:     Patient ID: William English, male   DOB: 1956/02/05, 60 y.o.   MRN: [id number]  HPI Patient seen for complete physical. Recent atypical chest pain which is actually improving. We have scheduled stress test but he has not been called yet for appointment. He also has had some vague intermittent numbness involving the face and that also seems to be improving. Patient takes no regular medications. Generally very healthy. Exercises most days of the week and has never had any exertional dyspnea or chest pains.  Right heel pain for over one year duration. He's tried heel inserts without improvement. Still some stretching and icing without improvement. Several years ago had this injected per orthopedics which helped. He is requesting the same today.  Past Medical History:  Diagnosis Date  . Abdominal pain, left upper quadrant 09/02/2009   History reviewed. No pertinent surgical history.  reports that he has never smoked. He has never used smokeless tobacco. He reports that he drinks alcohol. He reports that he does not use drugs. family history includes Atrial fibrillation in his father and mother; Heart attack in his maternal grandfather, paternal grandfather, and paternal uncle; Heart disease in his father; Hyperlipidemia in his father and mother. No Known Allergies   Review of Systems  Constitutional: Negative for activity change, appetite change, fatigue, fever and unexpected weight change.  HENT: Negative for congestion, ear pain and trouble swallowing.   Eyes: Negative for pain and visual disturbance.  Respiratory: Negative for cough, chest tightness, shortness of breath and wheezing.   Cardiovascular: Negative for chest pain, palpitations and leg swelling.  Gastrointestinal: Negative for abdominal distention, abdominal pain, blood in stool, constipation, diarrhea, nausea, rectal pain and vomiting.  Endocrine: Negative for polydipsia and polyuria.  Genitourinary: Negative for dysuria,  hematuria and testicular pain.  Musculoskeletal: Negative for arthralgias and joint swelling.  Skin: Negative for rash.  Neurological: Negative for dizziness, syncope, weakness, light-headedness and headaches.  Hematological: Negative for adenopathy.  Psychiatric/Behavioral: Negative for confusion and dysphoric mood.       Objective:   Physical Exam  Constitutional: He is oriented to person, place, and time. He appears well-developed and well-nourished. No distress.  HENT:  Head: Normocephalic and atraumatic.  Right Ear: External ear normal.  Left Ear: External ear normal.  Mouth/Throat: Oropharynx is clear and moist.  Eyes: Conjunctivae and EOM are normal. Pupils are equal, round, and reactive to light.  Neck: Normal range of motion. Neck supple. No thyromegaly present.  Cardiovascular: Normal rate, regular rhythm and normal heart sounds.   No murmur heard. Pulmonary/Chest: No respiratory distress. He has no wheezes. He has no rales.  Abdominal: Soft. Bowel sounds are normal. He exhibits no distension and no mass. There is no tenderness. There is no rebound and no guarding.  Musculoskeletal: He exhibits no edema.  Patient has tenderness on the proximal plantar fascia of the right heel. No visible swelling or erythema or warmth  Lymphadenopathy:    He has no cervical adenopathy.  Neurological: He is alert and oriented to person, place, and time. He displays normal reflexes. No cranial nerve deficit.  Skin: No rash noted.  Psychiatric: He has a normal mood and affect.       Assessment:     Physical exam. Labs reviewed. He has dyslipidemia with 10 year risk of CAD event 9.6%.  Right plantar fasciitis    Plan:     -We discussed pros and cons of statin therapy and at this point he declines -  Continue regular exercise habits and low saturated fat diet -Discussed risk and benefits of steroid injection right plantar fascia including risk of bleeding, bruising, infection, and fat  pad atrophy and patient consented. Prepped skin with Betadine. Using #23-gauge one and one half inch needle-injected 1/2 mL of Depo-Medrol and 1/2 mL of plain Xylocaine.  We did meet some resistance (suspect large spur).  Pt tolerated well.  Will continue with icing and stretches.  Eulas Post MD [facility] at [facility]

## 2016-10-29 NOTE — Patient Instructions (Signed)
Plantar Fasciitis Plantar fasciitis is a painful foot condition that affects the heel. It occurs when the band of tissue that connects the toes to the heel bone (plantar fascia) becomes irritated. This can happen after exercising too much or doing other repetitive activities (overuse injury). The pain from plantar fasciitis can range from mild irritation to severe pain that makes it difficult for you to walk or move. The pain is usually worse in the morning or after you have been sitting or lying down for a while. CAUSES This condition may be caused by:  Standing for long periods of time.  Wearing shoes that do not fit.  Doing high-impact activities, including running, aerobics, and ballet.  Being overweight.  Having an abnormal way of walking (gait).  Having tight calf muscles.  Having high arches in your feet.  Starting a new athletic activity. SYMPTOMS The main symptom of this condition is heel pain. Other symptoms include:  Pain that gets worse after activity or exercise.  Pain that is worse in the morning or after resting.  Pain that goes away after you walk for a few minutes. DIAGNOSIS This condition may be diagnosed based on your signs and symptoms. Your health care provider will also do a physical exam to check for:  A tender area on the bottom of your foot.  A high arch in your foot.  Pain when you move your foot.  Difficulty moving your foot. You may also need to have imaging studies to confirm the diagnosis. These can include:  X-rays.  Ultrasound.  MRI. TREATMENT  Treatment for plantar fasciitis depends on the severity of the condition. Your treatment may include:  Rest, ice, and over-the-counter pain medicines to manage your pain.  Exercises to stretch your calves and your plantar fascia.  A splint that holds your foot in a stretched, upward position while you sleep (night splint).  Physical therapy to relieve symptoms and prevent problems in the  future.  Cortisone injections to relieve severe pain.  Extracorporeal shock wave therapy (ESWT) to stimulate damaged plantar fascia with electrical impulses. It is often used as a last resort before surgery.  Surgery, if other treatments have not worked after 12 months. HOME CARE INSTRUCTIONS  Take medicines only as directed by your health care provider.  Avoid activities that cause pain.  Roll the bottom of your foot over a bag of ice or a bottle of cold water. Do this for 20 minutes, 3-4 times a day.  Perform simple stretches as directed by your health care provider.  Try wearing athletic shoes with air-sole or gel-sole cushions or soft shoe inserts.  Wear a night splint while sleeping, if directed by your health care provider.  Keep all follow-up appointments with your health care provider. PREVENTION   Do not perform exercises or activities that cause heel pain.  Consider finding low-impact activities if you continue to have problems.  Lose weight if you need to. The best way to prevent plantar fasciitis is to avoid the activities that aggravate your plantar fascia. SEEK MEDICAL CARE IF:  Your symptoms do not go away after treatment with home care measures.  Your pain gets worse.  Your pain affects your ability to move or do your daily activities.   This information is not intended to replace advice given to you by your health care provider. Make sure you discuss any questions you have with your health care provider.   Document Released: 09/11/2001 Document Revised: 09/07/2015 Document Reviewed: 10/27/2014 Elsevier   Interactive Patient Education 2016 [employer]  

## 2016-11-05 ENCOUNTER — Telehealth: Payer: Self-pay | Admitting: Family Medicine

## 2016-11-05 NOTE — Telephone Encounter (Signed)
Pt calling to check the status of the referral to have a stress test done he state he has yet to hear anything from anyone and it has been over 2 weeks.

## 2016-11-06 NOTE — Telephone Encounter (Signed)
Can you look into this for me. Thank you.

## 2016-11-12 ENCOUNTER — Telehealth (HOSPITAL_COMMUNITY): Payer: Self-pay | Admitting: *Deleted

## 2016-11-12 NOTE — Telephone Encounter (Signed)
Left message on voicemail per DPR in reference to upcoming appointment scheduled on 11/15/16 at 0730 with detailed instructions given per Myocardial Perfusion Study Information Sheet for the test. LM to arrive 15 minutes early, and that it is imperative to arrive on time for appointment to keep from having the test rescheduled. If you need to cancel or reschedule your appointment, please call the office within 24 hours of your appointment. Failure to do so may result in a cancellation of your appointment, and a $50 no show fee. Phone number given for call back for any questions.

## 2016-11-13 NOTE — Telephone Encounter (Signed)
Pt calling and would like to have call back from Dr. Elease Hashimoto today if possible concerning his stress test early Thursday morning.

## 2016-11-13 NOTE — Telephone Encounter (Signed)
Pt states he has the stress test tomorrow and would like Dr Elease Hashimoto to call him personally. He has some questions.

## 2016-11-14 NOTE — Telephone Encounter (Signed)
Spoke with pt.  He had some concern re: radiation exposure with getting nuclear stress test.  After discussion, he feels at ease with proceeding with further evaluation.

## 2016-11-14 NOTE — Telephone Encounter (Signed)
Have you spoke with this patient?

## 2016-11-15 ENCOUNTER — Ambulatory Visit (HOSPITAL_COMMUNITY): Payer: 59 | Attending: Cardiology

## 2016-11-15 DIAGNOSIS — R079 Chest pain, unspecified: Secondary | ICD-10-CM | POA: Insufficient documentation

## 2016-11-15 DIAGNOSIS — R0789 Other chest pain: Secondary | ICD-10-CM

## 2016-11-15 LAB — MYOCARDIAL PERFUSION IMAGING
CHL CUP MPHR: 161 {beats}/min
CHL CUP NUCLEAR SRS: 0
CHL CUP NUCLEAR SSS: 2
CSEPEW: 10.1 METS
CSEPPHR: 171 {beats}/min
Exercise duration (min): 9 min
Exercise duration (sec): 0 s
LHR: 0.26
LV dias vol: 137 mL (ref 62–150)
LV sys vol: 70 mL
NUC STRESS TID: 1.06
Percent HR: 106 %
Rest HR: 61 {beats}/min
SDS: 2

## 2016-11-15 MED ORDER — TECHNETIUM TC 99M TETROFOSMIN IV KIT
11.0000 | PACK | Freq: Once | INTRAVENOUS | Status: AC | PRN
  Administered 2016-11-15: 11 via INTRAVENOUS
  Filled 2016-11-15: qty 11

## 2016-11-15 MED ORDER — TECHNETIUM TC 99M TETROFOSMIN IV KIT
31.6000 | PACK | Freq: Once | INTRAVENOUS | Status: AC | PRN
  Administered 2016-11-15: 31.6 via INTRAVENOUS
  Filled 2016-11-15: qty 32

## 2017-01-03 DIAGNOSIS — K6389 Other specified diseases of intestine: Secondary | ICD-10-CM | POA: Diagnosis not present

## 2017-01-03 DIAGNOSIS — Z1211 Encounter for screening for malignant neoplasm of colon: Secondary | ICD-10-CM | POA: Diagnosis not present

## 2017-01-03 DIAGNOSIS — K635 Polyp of colon: Secondary | ICD-10-CM | POA: Diagnosis not present

## 2017-01-03 DIAGNOSIS — D120 Benign neoplasm of cecum: Secondary | ICD-10-CM | POA: Diagnosis not present

## 2017-01-23 ENCOUNTER — Telehealth: Payer: Self-pay | Admitting: Family Medicine

## 2017-01-23 NOTE — Telephone Encounter (Signed)
Pt would like a recommendation for foot surgeon due to bone spur

## 2017-01-24 NOTE — Telephone Encounter (Signed)
Please advise on Recommendations. I know you use Dr. Caffie Pinto for podiatry but not sure if she would do this type of procedure. Thanks.

## 2017-01-25 NOTE — Telephone Encounter (Signed)
I recommend Dr Doran Durand with [facility].

## 2017-01-25 NOTE — Telephone Encounter (Signed)
Pt is aware of recommendations

## 2017-03-12 ENCOUNTER — Ambulatory Visit (INDEPENDENT_AMBULATORY_CARE_PROVIDER_SITE_OTHER): Payer: 59 | Admitting: Family Medicine

## 2017-03-12 ENCOUNTER — Encounter: Payer: Self-pay | Admitting: Family Medicine

## 2017-03-12 VITALS — BP 110/80 | HR 70 | Temp 98.0°F | Wt 253.8 lb

## 2017-03-12 DIAGNOSIS — J209 Acute bronchitis, unspecified: Secondary | ICD-10-CM

## 2017-03-12 DIAGNOSIS — R202 Paresthesia of skin: Secondary | ICD-10-CM

## 2017-03-12 MED ORDER — PREDNISONE 10 MG PO TABS: ORAL_TABLET | ORAL | 0 refills | Status: DC

## 2017-03-12 NOTE — Patient Instructions (Signed)

## 2017-03-12 NOTE — Progress Notes (Signed)
Pre visit review using our clinic review tool, if applicable. No additional management support is needed unless otherwise documented below in the visit note. 

## 2017-03-12 NOTE — Progress Notes (Signed)
Subjective:     Patient ID: William English, male   DOB: [birth year], 61 y.o.   MRN: [id number]  HPI Patient seen with following items  First issue is 2 week history of productive cough. Frequent productive cough early morning with green-yellow sputum. No dyspnea. No fevers or chills. He started off with sore throat and laryngitis symptoms and then progressed to cough. Nonsmoker. No recent appetite or weight changes. Cough is relatively mild. Still exercising without difficulty.  Second issue is he's had about 2-1/2 week history of some numbness which radiates all within the right shoulder down to the right hand. He feels like his whole hand goes numb frequently. His had some worsening at night with position but some constant numbness especially involving the right index finger on the radial aspect. Minimal neck pain. No weakness. He is right-hand dominant. Still playing golf without difficulty. No loss of grip strength.  He had cervical MRI back in 2008 which showed only minimal degenerative changes. No recent injury  Past Medical History:  Diagnosis Date  . Abdominal pain, left upper quadrant 09/02/2009   No past surgical history on file.  reports that he has never smoked. He has never used smokeless tobacco. He reports that he drinks alcohol. He reports that he does not use drugs. family history includes Atrial fibrillation in his father and mother; Heart attack in his maternal grandfather, paternal grandfather, and paternal uncle; Heart disease in his father; Hyperlipidemia in his father and mother. No Known Allergies   Review of Systems  Constitutional: Negative for chills, fatigue, fever and unexpected weight change.  Eyes: Negative for visual disturbance.  Respiratory: Positive for cough. Negative for chest tightness and shortness of breath.   Cardiovascular: Negative for chest pain, palpitations and leg swelling.  Gastrointestinal: Negative for abdominal pain.  Endocrine: Negative for  polydipsia and polyuria.  Neurological: Positive for numbness. Negative for dizziness, syncope, speech difficulty, weakness, light-headedness and headaches.       Objective:   Physical Exam  Constitutional: He is oriented to person, place, and time. He appears well-developed and well-nourished.  HENT:  Right Ear: External ear normal.  Left Ear: External ear normal.  Mouth/Throat: Oropharynx is clear and moist.  Eyes: Pupils are equal, round, and reactive to light.  Neck: Neck supple. No thyromegaly present.  Cardiovascular: Normal rate and regular rhythm.   Pulmonary/Chest: Effort normal and breath sounds normal. No respiratory distress. He has no wheezes. He has no rales.  Musculoskeletal: He exhibits no edema.  Neurological: He is alert and oriented to person, place, and time.  Full strength upper extremities. Trace reflux brachioradialis and biceps bilaterally. He has impaired sensory function on the radial aspect of the right index finger with monofilament testing. Otherwise, monofilament testing intact.       Assessment:     #1 acute cough. Suspect acute viral bronchitis  #2 paresthesias involving right upper extremity predominantly right hand without any associated weakness and minimal pain. Doubt carpal tunnel as he has numbness involving the entire right upper extremity. Suspect cervical nerve root impingement.    Plan:     -Trial of prednisone taper over the next week -Reassess in 2 weeks. Consider cervical MRI at that point if no improvement in follow-up sooner for any weakness or progressive symptoms -no indications for antibiotic at this time.  Eulas Post MD [facility] at [facility]

## 2017-04-26 ENCOUNTER — Telehealth: Payer: Self-pay | Admitting: Family Medicine

## 2017-04-26 MED ORDER — AZITHROMYCIN 250 MG PO TABS
ORAL_TABLET | ORAL | 0 refills | Status: DC
Start: 2017-04-26 — End: 2017-10-09

## 2017-04-26 NOTE — Telephone Encounter (Signed)
rx sent and patient aware.  

## 2017-04-26 NOTE — Telephone Encounter (Signed)
Pt would like to see if Dr. Elease Hashimoto would send in a antibiotic for the persistent cough that he still has from 03/12/17 when he came in.  Pharm:  [facility]

## 2017-04-26 NOTE — Telephone Encounter (Signed)
Let's start Zithromax (Z-pack) and if no better in one week- follow up.

## 2017-10-03 DIAGNOSIS — M4036 Flatback syndrome, lumbar region: Secondary | ICD-10-CM | POA: Diagnosis not present

## 2017-10-03 DIAGNOSIS — M9903 Segmental and somatic dysfunction of lumbar region: Secondary | ICD-10-CM | POA: Diagnosis not present

## 2017-10-03 DIAGNOSIS — M9905 Segmental and somatic dysfunction of pelvic region: Secondary | ICD-10-CM | POA: Diagnosis not present

## 2017-10-09 ENCOUNTER — Encounter: Payer: Self-pay | Admitting: Family Medicine

## 2017-10-09 ENCOUNTER — Ambulatory Visit (INDEPENDENT_AMBULATORY_CARE_PROVIDER_SITE_OTHER): Payer: 59 | Admitting: Family Medicine

## 2017-10-09 VITALS — BP 128/80 | HR 67 | Ht 71.25 in | Wt 256.1 lb

## 2017-10-09 DIAGNOSIS — M545 Low back pain, unspecified: Secondary | ICD-10-CM

## 2017-10-09 DIAGNOSIS — Z23 Encounter for immunization: Secondary | ICD-10-CM

## 2017-10-09 MED ORDER — PREDNISONE 10 MG PO TABS
ORAL_TABLET | ORAL | 0 refills | Status: DC
Start: 2017-10-09 — End: 2018-07-01

## 2017-10-09 MED ORDER — CYCLOBENZAPRINE HCL 10 MG PO TABS: 10.0000 mg | ORAL_TABLET | Freq: Three times a day (TID) | ORAL | 0 refills | Status: DC | PRN

## 2017-10-09 NOTE — Patient Instructions (Signed)
Low Back Sprain  A sprain is a stretch or tear in the bands of tissue that hold bones and joints together (ligaments). Sprains of the lower back (lumbar spine) are a common cause of low back pain. A sprain occurs when ligaments are overextended or stretched beyond their limits. The ligaments can become inflamed, resulting in pain and sudden muscle tightening (spasms). A sprain can be caused by an injury (trauma), or it can develop gradually due to overuse.  There are three types of sprains:  · Grade 1 is a mild sprain involving an overstretched ligament or a very slight tear of the ligament.  · Grade 2 is a moderate sprain involving a partial tear of the ligament.  · Grade 3 is a severe sprain involving a complete tear of the ligament.    What are the causes?  This condition may be caused by:  · Trauma, such as a fall or a hit to the body.  · Twisting or overstretching the back. This may result from doing activities that require a lot of energy, such as lifting heavy objects.    What increases the risk?  The following factors may increase your risk of getting this condition:  · Playing contact sports.  · Participating in sports or activities that put excessive stress on the back and require a lot of bending and twisting, including:  ? Lifting weights or heavy objects.  ? Gymnastics.  ? Soccer.  ? Figure skating.  ? Snowboarding.  · Being overweight or obese.  · Having poor strength and flexibility.    What are the signs or symptoms?  Symptoms of this condition may include:  · Sharp or dull pain in the lower back that does not go away. Pain may extend to the buttocks.  · Stiffness.  · Limited range of motion.  · Inability to stand up straight due to stiffness or pain.  · Muscle spasms.    How is this diagnosed?    This condition may be diagnosed based on:  · Your symptoms.  · Your medical history.  · A physical exam.  ? Your health care provider may push on certain areas of your back to determine the source of your  pain.  ? You may be asked to bend forward, backward, and side to side to assess the severity of your pain and your range of motion.  · Imaging tests, such as:  ? X-rays.  ? MRI.    How is this treated?  Treatment for this condition may include:  · Applying heat and cold to the affected area.  · Medicines to help relieve pain and to relax your muscles (muscle relaxants).  · NSAIDs to help reduce swelling and discomfort.  · Physical therapy.    When your symptoms improve, it is important to gradually return to your normal routine as soon as possible to reduce pain, avoid stiffness, and avoid loss of muscle strength. Generally, symptoms should improve within 6 weeks of treatment. However, recovery time varies.  Follow these instructions at home:  Managing pain, stiffness, and swelling  · If directed, apply ice to the injured area during the first 24 hours after your injury.  ? Put ice in a plastic bag.  ? Place a towel between your skin and the bag.  ? Leave the ice on for 20 minutes, 2-3 times a day.  · If directed, apply heat to the affected area as often as told by your health care provider. Use the   heat source that your health care provider recommends, such as a moist heat pack or a heating pad.  ? Place a towel between your skin and the heat source.  ? Leave the heat on for 20-30 minutes.  ? Remove the heat if your skin turns bright red. This is especially important if you are unable to feel pain, heat, or cold. You may have a greater risk of getting burned.  Activity  · Rest and return to your normal activities as told by your health care provider. Ask your health care provider what activities are safe for you.  · Avoid activities that take a lot of effort (are strenuous) for as long as told by your health care provider.  · Do exercises as told by your health care provider.  General instructions    · Take over-the-counter and prescription medicines only as told by your health care provider.  · If you have  questions or concerns about safety while taking pain medicine, talk with your health care provider.  · Do not drive or operate heavy machinery until you know how your pain medicine affects you.  · Do not use any tobacco products, such as cigarettes, chewing tobacco, and e-cigarettes. Tobacco can delay bone healing. If you need help quitting, ask your health care provider.  · Keep all follow-up visits as told by your health care provider. This is important.  How is this prevented?  · Warm up and stretch before being active.  · Cool down and stretch after being active.  · Give your body time to rest between periods of activity.  · Avoid:  ? Being physically inactive for long periods at a time.  ? Exercising or playing sports when you are tired or in pain.  · Use correct form when playing sports and lifting heavy objects.  · Use good posture when sitting and standing.  · Maintain a healthy weight.  · Sleep on a mattress with medium firmness to support your back.  · Make sure to use equipment that fits you, including shoes that fit well.  · Be safe and responsible while being active to avoid falls.  · Do at least 150 minutes of moderate-intensity exercise each week, such as brisk walking or water aerobics. Try a form of exercise that takes stress off your back, such as swimming or stationary cycling.  · Maintain physical fitness, including:  ? Strength. In particular, develop and maintain strong abdominal muscles.  ? Flexibility.  ? Cardiovascular fitness.  ? Endurance.  Contact a health care provider if:  · Your back pain does not improve after 6 weeks of treatment.  · Your symptoms get worse.  Get help right away if:  · Your back pain is severe.  · You are unable to stand or walk.  · You develop pain in your legs.  · You develop weakness in your buttocks or legs.  · You have difficulty controlling when you urinate or when you have a bowel movement.  This information is not intended to replace advice given to you by  your health care provider. Make sure you discuss any questions you have with your health care provider.  Document Released: 12/17/2005 Document Revised: 08/23/2016 Document Reviewed: 09/28/2015  Elsevier Interactive Patient Education © 2018 [employer] Inc[employer]

## 2017-10-09 NOTE — Progress Notes (Signed)
Subjective:     Patient ID: William English, male   DOB: 1956-04-03, 61 y.o.   MRN: [id number]  HPI She is seen with low back pain since around May. Has had previous issues with this in the past. Back in May he was helping his daughter and son-in-law move and did a lot of lifting. Denies specific injury. He's had relatively constant dull achy pain somewhat bilateral since then. He has occasional numbness in the right foot and right thigh but no weakness. No urine or stool incontinence. Pain is relatively mild but interfering some with activities. Denies any radiculitis symptoms. Sometimes worse after prolonged periods of sitting but also sometimes with movement and change of positions.  Patient went to chiropractor about a week ago and had some type of decompression treatment and some type of laser treatment on muscles and states his pain was even worse afterwards.  Denies any fevers or chills. No appetite or weight changes. Wife had leftover Flexeril and that seemed to help last night. Minimal relief with ibuprofen  Past Medical History:  Diagnosis Date  . Abdominal pain, left upper quadrant 09/02/2009   No past surgical history on file.  reports that he has never smoked. He has never used smokeless tobacco. He reports that he drinks alcohol. He reports that he does not use drugs. family history includes Atrial fibrillation in his father and mother; Heart attack in his maternal grandfather, paternal grandfather, and paternal uncle; Heart disease in his father; Hyperlipidemia in his father and mother. No Known Allergies   Review of Systems  Constitutional: Negative for activity change, appetite change and fever.  Respiratory: Negative for cough and shortness of breath.   Cardiovascular: Negative for chest pain and leg swelling.  Gastrointestinal: Negative for abdominal pain and vomiting.  Genitourinary: Negative for dysuria, flank pain and hematuria.  Musculoskeletal: Positive for back pain.  Negative for joint swelling.  Neurological: Negative for weakness and numbness.       Objective:   Physical Exam  Constitutional: He is oriented to person, place, and time. He appears well-developed and well-nourished. No distress.  Neck: No thyromegaly present.  Cardiovascular: Normal rate, regular rhythm and normal heart sounds.   No murmur heard. Pulmonary/Chest: Effort normal and breath sounds normal. No respiratory distress. He has no wheezes. He has no rales.  Musculoskeletal: He exhibits no edema.  Straight leg raise are negative bilaterally. No spinal tenderness.  Neurological: He is alert and oriented to person, place, and time. He has normal reflexes. No cranial nerve deficit.  Full strength lower extremities throughout  Skin: No rash noted.       Assessment:     Several month history of relatively stable roughly constant bilateral lumbar back pain. Nonfocal neuro exam. No red flags such as fever, neurologic deficits, weight loss, etc.    Plan:     -Flexeril 10 mg daily at bedtime when necessary -Continue back extension stretches -Trial of prednisone taper which has benefited him in the past -Avoid concomitant use of non-steroidals -Consider trial of physical therapy if not improved with the above -Continue aerobic activity as tolerated  Eulas Post MD [facility] at [facility]

## 2018-02-04 DIAGNOSIS — R002 Palpitations: Secondary | ICD-10-CM | POA: Diagnosis not present

## 2018-02-05 DIAGNOSIS — R002 Palpitations: Secondary | ICD-10-CM | POA: Diagnosis not present

## 2018-02-07 DIAGNOSIS — R008 Other abnormalities of heart beat: Secondary | ICD-10-CM | POA: Diagnosis not present

## 2018-03-11 DIAGNOSIS — R002 Palpitations: Secondary | ICD-10-CM | POA: Diagnosis not present

## 2018-04-05 DIAGNOSIS — H33321 Round hole, right eye: Secondary | ICD-10-CM | POA: Diagnosis not present

## 2018-04-05 DIAGNOSIS — H43811 Vitreous degeneration, right eye: Secondary | ICD-10-CM | POA: Diagnosis not present

## 2018-04-05 DIAGNOSIS — H35411 Lattice degeneration of retina, right eye: Secondary | ICD-10-CM | POA: Diagnosis not present

## 2018-04-05 DIAGNOSIS — H35412 Lattice degeneration of retina, left eye: Secondary | ICD-10-CM | POA: Diagnosis not present

## 2018-04-10 DIAGNOSIS — H35413 Lattice degeneration of retina, bilateral: Secondary | ICD-10-CM | POA: Diagnosis not present

## 2018-06-05 DIAGNOSIS — H35413 Lattice degeneration of retina, bilateral: Secondary | ICD-10-CM | POA: Diagnosis not present

## 2018-06-05 DIAGNOSIS — H4423 Degenerative myopia, bilateral: Secondary | ICD-10-CM | POA: Diagnosis not present

## 2018-07-01 ENCOUNTER — Encounter: Payer: Self-pay | Admitting: Family Medicine

## 2018-07-01 ENCOUNTER — Ambulatory Visit (INDEPENDENT_AMBULATORY_CARE_PROVIDER_SITE_OTHER): Payer: 59 | Admitting: Family Medicine

## 2018-07-01 VITALS — BP 110/80 | HR 74 | Temp 97.8°F | Ht 72.5 in | Wt 250.0 lb

## 2018-07-01 DIAGNOSIS — Z0000 Encounter for general adult medical examination without abnormal findings: Secondary | ICD-10-CM

## 2018-07-01 LAB — CBC WITH DIFFERENTIAL/PLATELET
Basophils Absolute: 0.1 10*3/uL (ref 0.0–0.1)
Basophils Relative: 0.8 % (ref 0.0–3.0)
EOS ABS: 0.1 10*3/uL (ref 0.0–0.7)
EOS PCT: 2 % (ref 0.0–5.0)
HEMATOCRIT: 47.6 % (ref 39.0–52.0)
HEMOGLOBIN: 16.2 g/dL (ref 13.0–17.0)
Lymphocytes Relative: 27.7 % (ref 12.0–46.0)
Lymphs Abs: 1.7 10*3/uL (ref 0.7–4.0)
MCHC: 34.1 g/dL (ref 30.0–36.0)
MCV: 91.8 fl (ref 78.0–100.0)
MONO ABS: 0.6 10*3/uL (ref 0.1–1.0)
Monocytes Relative: 10.1 % (ref 3.0–12.0)
Neutro Abs: 3.6 10*3/uL (ref 1.4–7.7)
Neutrophils Relative %: 59.4 % (ref 43.0–77.0)
PLATELETS: 209 10*3/uL (ref 150.0–400.0)
RBC: 5.18 Mil/uL (ref 4.22–5.81)
RDW: 13.3 % (ref 11.5–15.5)
WBC: 6.1 10*3/uL (ref 4.0–10.5)

## 2018-07-01 LAB — TSH: TSH: 1.24 u[IU]/mL (ref 0.35–4.50)

## 2018-07-01 LAB — BASIC METABOLIC PANEL
BUN: 16 mg/dL (ref 6–23)
CALCIUM: 9.5 mg/dL (ref 8.4–10.5)
CO2: 32 meq/L (ref 19–32)
Chloride: 103 mEq/L (ref 96–112)
Creatinine, Ser: 0.92 mg/dL (ref 0.40–1.50)
GFR: 88.98 mL/min (ref >60.00)
GLUCOSE: 92 mg/dL (ref 70–99)
Potassium: 4.5 mEq/L (ref 3.5–5.1)
Sodium: 142 mEq/L (ref 135–145)

## 2018-07-01 LAB — HEPATIC FUNCTION PANEL
ALBUMIN: 4.3 g/dL (ref 3.5–5.2)
ALT: 32 U/L (ref 0–53)
AST: 22 U/L (ref 0–37)
Alkaline Phosphatase: 76 U/L (ref 39–117)
BILIRUBIN TOTAL: 0.7 mg/dL (ref 0.2–1.2)
Bilirubin, Direct: 0.1 mg/dL (ref 0.0–0.3)
Total Protein: 6.7 g/dL (ref 6.0–8.3)

## 2018-07-01 LAB — LIPID PANEL
Cholesterol: 242 mg/dL — ABNORMAL HIGH (ref 0–200)
HDL: 41.2 mg/dL (ref >39.00)
NONHDL: 201.22
TRIGLYCERIDES: 225 mg/dL — AB (ref 0.0–149.0)
Total CHOL/HDL Ratio: 6
VLDL: 45 mg/dL — ABNORMAL HIGH (ref 0.0–40.0)

## 2018-07-01 LAB — LDL CHOLESTEROL, DIRECT: Direct LDL: 151 mg/dL

## 2018-07-01 LAB — PSA: PSA: 0.47 ng/mL (ref 0.10–4.00)

## 2018-07-01 MED ORDER — METOPROLOL TARTRATE 25 MG PO TABS: ORAL_TABLET | ORAL | 0 refills | Status: DC

## 2018-07-01 NOTE — Progress Notes (Signed)
Subjective:     Patient ID: William English, male   DOB: 11-Aug-1956, 62 y.o.   MRN: [id number]  HPI Patient is here for physical exam. He has history of obesity and mild hyperlipidemia. He was placed by cardiology on low-dose Crestor 5 mgs every other day. Tolerating well.  Past history of some palpitations and previously was prescribed low-dose Lopressor to take only as needed for severe palpitations. He's had fairly complete workup including echocardiogram in the past year with no major structural heart problems. Nuclear stress test couple years ago unremarkable. Stays active with exercise and generally tolerating well. No recent chest pains.  Health maintenance reviewed  -No history of hepatitis C screening -Tetanus 2012 -Colonoscopy 2012 with recommended ten yr follow-up -Gets yearly flu vaccine -No history of shingles vaccine  Past Medical History:  Diagnosis Date  . Abdominal pain, left upper quadrant 09/02/2009   No past surgical history on file.  reports that he has never smoked. He has never used smokeless tobacco. He reports that he drinks alcohol. He reports that he does not use drugs. family history includes Atrial fibrillation in his father and mother; Heart attack in his maternal grandfather, paternal grandfather, and paternal uncle; Heart disease in his father; Hyperlipidemia in his father and mother. No Known Allergies   Review of Systems  Constitutional: Negative for activity change, appetite change, fatigue, fever and unexpected weight change.  HENT: Negative for congestion, ear pain and trouble swallowing.   Eyes: Negative for pain and visual disturbance.  Respiratory: Negative for cough, chest tightness, shortness of breath and wheezing.   Cardiovascular: Positive for palpitations. Negative for chest pain.  Gastrointestinal: Negative for abdominal distention, abdominal pain, blood in stool, constipation, diarrhea, nausea, rectal pain and vomiting.  Endocrine: Negative  for polydipsia and polyuria.  Genitourinary: Negative for dysuria, hematuria and testicular pain.  Musculoskeletal: Negative for arthralgias and joint swelling.  Skin: Negative for rash.  Neurological: Negative for dizziness, syncope and headaches.  Hematological: Negative for adenopathy.  Psychiatric/Behavioral: Negative for confusion and dysphoric mood.       Objective:   Physical Exam  Constitutional: He is oriented to person, place, and time. He appears well-developed and well-nourished. No distress.  HENT:  Head: Normocephalic and atraumatic.  Right Ear: External ear normal.  Left Ear: External ear normal.  Mouth/Throat: Oropharynx is clear and moist.  Eyes: Pupils are equal, round, and reactive to light. Conjunctivae and EOM are normal.  Neck: Normal range of motion. Neck supple. No thyromegaly present.  Cardiovascular: Normal rate, regular rhythm and normal heart sounds.  No murmur heard. Pulmonary/Chest: No respiratory distress. He has no wheezes. He has no rales.  Abdominal: Soft. Bowel sounds are normal. He exhibits no distension and no mass. There is no tenderness. There is no rebound and no guarding.  Musculoskeletal: He exhibits no edema.  Lymphadenopathy:    He has no cervical adenopathy.  Neurological: He is alert and oriented to person, place, and time. He displays normal reflexes. No cranial nerve deficit.  Skin: No rash noted.  Psychiatric: He has a normal mood and affect.       Assessment:     Physical exam. Generally healthy 62 year old male. The following health maintenance issues were dressed    Plan:     -Check lab work including hepatitis C antibody and PSA -Discussed shingles vaccine and patient would like to proceed with that. He will placed on her waiting list -Continue regular exercise habits. Is encouraged to try to lose  a few pounds  Eulas Post MD [facility] at [facility]

## 2018-07-01 NOTE — Patient Instructions (Signed)
Check on coverage for the shingles vaccine (Shingrix) and let us know if interested.

## 2018-07-02 LAB — HEPATITIS C ANTIBODY
HEP C AB: NONREACTIVE
SIGNAL TO CUT-OFF: 0.01 (ref <1.00)

## 2018-07-21 ENCOUNTER — Ambulatory Visit: Payer: 59

## 2018-07-22 ENCOUNTER — Ambulatory Visit (INDEPENDENT_AMBULATORY_CARE_PROVIDER_SITE_OTHER): Payer: 59 | Admitting: *Deleted

## 2018-07-22 DIAGNOSIS — Z23 Encounter for immunization: Secondary | ICD-10-CM

## 2018-07-22 NOTE — Progress Notes (Signed)
Per orders of Dr. Elease Hashimoto, injection of Shingrix given by Dorrene German. Patient tolerated injection well.

## 2018-08-18 ENCOUNTER — Telehealth: Payer: Self-pay | Admitting: Family Medicine

## 2018-08-18 NOTE — Telephone Encounter (Signed)
Copied from [place] [id number]. Topic: General - Other >> Aug 18, 2018  4:17 PM Cecelia Byars, NT wrote: Reason for CRM: Patient is going to [place]  and would like to make sure his immunizations are all current please call him ,he is leaving the first week in September ,please advise  [phone]

## 2018-08-18 NOTE — Telephone Encounter (Signed)
Chart reviewed and he seems to be largely UTD on vaccines, but looks like he may need updated typhoid and Hep B vaccines and malaria prophylaxis per CDC (linked below).  [email or web address]

## 2018-08-18 NOTE — Telephone Encounter (Signed)
Will need Vivotif one every other day for 4 doses #4 with no refill  Would not need Hep B series unless risk for blood and body fluid exposure (eg healthcare)  Malaria risk appears to be fairly low but does exist in some areas.  To be safe would go ahead and take Malarone 250 mg take one daily starting one day prior to travel, during travel, and for one week after return.  #16  Offer office follow up if further questions.

## 2018-08-19 MED ORDER — TYPHOID VACCINE PO CPDR: 1.0000 | DELAYED_RELEASE_CAPSULE | ORAL | 0 refills | Status: DC

## 2018-08-19 MED ORDER — ATOVAQUONE-PROGUANIL HCL 250-100 MG PO TABS: ORAL_TABLET | ORAL | 0 refills | Status: DC

## 2018-08-19 NOTE — Telephone Encounter (Signed)
Called patient and left message to return call. Rxs pended in encounter, will need to verify pharmacy when patient returns call.

## 2018-08-19 NOTE — Telephone Encounter (Signed)
William English, patient said that [facility] uses [facility] at [place]. Thanks!

## 2018-08-19 NOTE — Telephone Encounter (Signed)
I agree with your advice not to give the Japanese encephalitis vaccine.

## 2018-08-19 NOTE — Telephone Encounter (Signed)
Medication filled to pharmacy as requested and patient notified. States he will not have exposure to blood/body fluids so will not need Hep B. He did want to know if needs Japanese encephalitis vaccine, declined to schedule appt. Reviewed CDC recommendations w/ patient: "You may need this vaccine if your trip will last more than a month, depending on where you are going in [place] and what time of year you are traveling. You should also consider this vaccine if you plan to visit rural areas in [place] or will be spending a lot of time outdoors, even for trips shorter than a month."  Patient states he will only be there 10 days and will not be in rural areas or spending a great deal of time outdoors, so advise that he should not need this vaccine. Please advise if you have different recommendation.

## 2018-08-23 ENCOUNTER — Other Ambulatory Visit: Payer: Self-pay | Admitting: Family Medicine

## 2018-10-07 ENCOUNTER — Ambulatory Visit (INDEPENDENT_AMBULATORY_CARE_PROVIDER_SITE_OTHER): Payer: 59

## 2018-10-07 DIAGNOSIS — Z23 Encounter for immunization: Secondary | ICD-10-CM

## 2018-10-07 NOTE — Progress Notes (Signed)
Per orders of Dr. Elease Hashimoto, injection of Shingrix and Fluarix given by Rebecca Eaton. Patient tolerated injection well.

## 2018-12-12 DIAGNOSIS — H53021 Refractive amblyopia, right eye: Secondary | ICD-10-CM | POA: Diagnosis not present

## 2018-12-12 DIAGNOSIS — H4423 Degenerative myopia, bilateral: Secondary | ICD-10-CM | POA: Diagnosis not present

## 2019-04-01 ENCOUNTER — Other Ambulatory Visit: Payer: Self-pay

## 2019-04-01 ENCOUNTER — Telehealth: Payer: Self-pay | Admitting: Family Medicine

## 2019-04-01 ENCOUNTER — Ambulatory Visit (INDEPENDENT_AMBULATORY_CARE_PROVIDER_SITE_OTHER): Payer: 59 | Admitting: Family Medicine

## 2019-04-01 DIAGNOSIS — M5412 Radiculopathy, cervical region: Secondary | ICD-10-CM | POA: Diagnosis not present

## 2019-04-01 MED ORDER — TRAMADOL HCL 50 MG PO TABS: ORAL_TABLET | ORAL | 0 refills | Status: DC

## 2019-04-01 MED ORDER — PREDNISONE 10 MG PO TABS: ORAL_TABLET | ORAL | 0 refills | Status: DC

## 2019-04-01 NOTE — Progress Notes (Signed)
Patient ID: William English, male   DOB: 1956-12-15, 63 y.o.   MRN: [id number]  Virtual Visit via Video Note  I connected with William English on 04/01/19 at  8:00 AM EDT by a video enabled telemedicine application and verified that I am speaking with the correct person using two identifiers.  Location patient: home Location provider:work or home office Persons participating in the virtual visit: patient, provider  I discussed the limitations of evaluation and management by telemedicine and the availability of in person appointments. The patient expressed understanding and agreed to proceed.   HPI: Patient had called yesterday with some left neck and upper extremity pain.  This is a new problem evaluation.  He states that Thursday he went out to play some golf.  He was not aware of specific injury but afterwards noticed some pain at the base of the left neck radiating down toward the elbow region.  No numbness or weakness.  Pain is worse at night.  8-9 out of 10 in severity.  Interfering with sleep.  He has tried Tylenol and ibuprofen without much improvement.  Patient had cervical MRI back in 2008 which showed mild degenerative changes most pronounced C3-C4 with disc bulge at that level.  He denies any chest pains.  No left upper extremity swelling.  No pain with abduction or internal rotation.  He has some mild pain with lateral bending of the neck to the right side   ROS: See pertinent positives and negatives per HPI.  Past Medical History:  Diagnosis Date  . Abdominal pain, left upper quadrant 09/02/2009    No past surgical history on file.  Family History  Problem Relation Age of Onset  . Heart disease Father   . Atrial fibrillation Father   . Hyperlipidemia Father   . Atrial fibrillation Mother   . Hyperlipidemia Mother   . Heart attack Paternal Uncle   . Heart attack Maternal Grandfather   . Heart attack Paternal Grandfather     SOCIAL HX: Non-smoker.  He works for a [employer] as a  Engineer, water   Current Outpatient Medications:  .  Ascorbic Acid (VITAMIN C) 100 MG tablet, Take 100 mg by mouth daily., Disp: , Rfl:  .  aspirin 81 MG tablet, Take 81 mg by mouth daily. Takes 1-2 times per week., Disp: , Rfl:  .  atovaquone-proguanil (MALARONE) 250-100 MG TABS tablet, Take one tablet daily starting one day prior to travel, during travel, and for one week after return., Disp: 16 tablet, Rfl: 0 .  cyclobenzaprine (FLEXERIL) 10 MG tablet, Take 1 tablet (10 mg total) by mouth 3 (three) times daily as needed for muscle spasms., Disp: 30 tablet, Rfl: 0 .  glucosamine-chondroitin 500-400 MG tablet, Take 1 tablet by mouth daily., Disp: , Rfl:  .  metoprolol tartrate (LOPRESSOR) 25 MG tablet, TAKE ONE DAILY AS NEEDED FOR SEVERE PALPITATIONS., Disp: 90 tablet, Rfl: 2 .  Multiple Vitamins-Minerals (MULTIVITAMIN WITH MINERALS) tablet, Take 1 tablet by mouth daily., Disp: , Rfl:  .  omeprazole (PRILOSEC) 20 MG capsule, Take 20 mg by mouth as needed. , Disp: , Rfl:  .  rosuvastatin (CRESTOR) 5 MG tablet, Take 5 mg by mouth every other day., Disp: , Rfl:  .  typhoid (VIVOTIF) DR capsule, Take 1 capsule by mouth every other day., Disp: 4 capsule, Rfl: 0  EXAM:  VITALS per patient if applicable:  GENERAL: alert, oriented, appears well and in no acute distress  HEENT: atraumatic, conjunttiva clear, no obvious  abnormalities on inspection of external nose and ears  NECK: normal movements of the head and neck  LUNGS: on inspection no signs of respiratory distress, breathing rate appears normal, no obvious gross SOB, gasping or wheezing  CV: no obvious cyanosis  MS: moves all visible extremities without noticeable abnormality  PSYCH/NEURO: pleasant and cooperative, no obvious depression or anxiety, speech and thought processing grossly intact  ASSESSMENT AND PLAN:  Discussed the following assessment and plan:  Left cervical neck pain with radiculitis type symptoms.  Doubt  rotator cuff injury or rotator cuff tendinitis.  He does not describe any weakness or numbness.  -Recommended trial of prednisone taper and reviewed potential side effects -Tramadol 50 mg 1-2 every 6 hours as needed for severe pain -Watch for any weakness or numbness or progressive pain     I discussed the assessment and treatment plan with the patient. The patient was provided an opportunity to ask questions and all were answered. The patient agreed with the plan and demonstrated an understanding of the instructions.   The patient was advised to call back or seek an in-person evaluation if the symptoms worsen or if the condition fails to improve as anticipated.   Carolann Littler, MD

## 2019-04-01 NOTE — Telephone Encounter (Signed)
Please see message. °

## 2019-04-01 NOTE — Telephone Encounter (Signed)
Patient was seen via webex today and the Rx was never sent in.   Please Advise

## 2019-04-01 NOTE — Telephone Encounter (Signed)
Sorry.  Handling right now.

## 2019-04-01 NOTE — Addendum Note (Signed)
Addended by: Eulas Post on: 04/01/2019 03:46 PM   Modules accepted: Orders

## 2019-04-02 ENCOUNTER — Telehealth: Payer: Self-pay | Admitting: Family Medicine

## 2019-04-02 NOTE — Telephone Encounter (Signed)
William English called in for [name] and asked that the prednisone and tramadol be [facility]oned [facility] to [facility] [address] because they have a drive thru. Routing to PCP.

## 2019-04-03 ENCOUNTER — Other Ambulatory Visit: Payer: Self-pay

## 2019-04-03 ENCOUNTER — Telehealth: Payer: Self-pay | Admitting: Family Medicine

## 2019-04-03 MED ORDER — PREDNISONE 10 MG PO TABS: ORAL_TABLET | ORAL | 0 refills | Status: DC

## 2019-04-03 MED ORDER — METHOCARBAMOL 500 MG PO TABS: 500.0000 mg | ORAL_TABLET | Freq: Four times a day (QID) | ORAL | 0 refills | Status: DC | PRN

## 2019-04-03 MED ORDER — TRAMADOL HCL 50 MG PO TABS: ORAL_TABLET | ORAL | 0 refills | Status: DC

## 2019-04-03 NOTE — Telephone Encounter (Signed)
Please see message. °

## 2019-04-03 NOTE — Telephone Encounter (Signed)
Robaxin 500 mg one every 6 hours prn muscle spasm #30.

## 2019-04-03 NOTE — Telephone Encounter (Signed)
I sent a pending order for Tramadol to be [facility] to the [facility] at [place]. I have already sent the Prednisone. Thanks!

## 2019-04-03 NOTE — Telephone Encounter (Signed)
Copied from [place] [id number]. Topic: Quick Communication - See Telephone Encounter >> Apr 03, 2019 10:12 AM Rutherford Nail, NT wrote: CRM for notification. See Telephone encounter for: 04/03/19. Patient calling and is requesting a muscle relaxer. States that he had a virtual visit with Dr Elease Hashimoto on Monday and was diagnosed with a pinched nerve in his back. States that prednisone and a pain killer were given, but the pain medication is not doing much help. States that the muscles around the area feel tight. Please advise.   [facility] [place].

## 2019-04-03 NOTE — Telephone Encounter (Signed)
This medication has been sent to the pharmacy for the patient. °

## 2019-04-22 ENCOUNTER — Other Ambulatory Visit: Payer: Self-pay | Admitting: Cardiology

## 2019-04-22 NOTE — Telephone Encounter (Signed)
°*  STAT* If patient is at the pharmacy, call can be transferred to refill team.   1. Which medications need to be refilled? (please list name of each medication and dose if known) rosuvastatin (CRESTOR) 5 MG tablet   2. Which pharmacy/location (including street and city if local pharmacy) is medication to be sent to? [facility] [place] [place] [address] [phone] (Phone) [phone] (Fax)    3. Do they need a 30 day or 90 day supply? 90 day

## 2019-04-23 MED ORDER — ROSUVASTATIN CALCIUM 5 MG PO TABS: 5.0000 mg | ORAL_TABLET | ORAL | 0 refills | Status: DC

## 2019-04-23 NOTE — Telephone Encounter (Signed)
[facility] [address] [facility] [facility]

## 2019-05-07 ENCOUNTER — Telehealth: Payer: Self-pay | Admitting: Family Medicine

## 2019-05-07 NOTE — Telephone Encounter (Signed)
Last OV 04/01/19, No future OV  Last filled by Park Liter, MD on 04/23/19  I see note in lab results that says to take 10 mg that nurses did not send.  OK to send in the 10 mg for patient?

## 2019-05-07 NOTE — Telephone Encounter (Signed)
OK to send.

## 2019-05-07 NOTE — Telephone Encounter (Signed)
Copied from [place] [id number]. Topic: General - Other >> May 07, 2019  9:09 AM Lennox Solders wrote: Reason for CRM:pt is calling and per pt dr burchette change generic crestor 5 mg  to 10 mg. [facility] [address]

## 2019-05-08 ENCOUNTER — Other Ambulatory Visit: Payer: Self-pay

## 2019-05-08 MED ORDER — ROSUVASTATIN CALCIUM 10 MG PO TABS: 10.0000 mg | ORAL_TABLET | Freq: Every day | ORAL | 3 refills | Status: DC

## 2019-05-08 NOTE — Telephone Encounter (Signed)
Refill has been sent.  °

## 2019-06-24 ENCOUNTER — Other Ambulatory Visit: Payer: Self-pay | Admitting: Family Medicine

## 2020-04-27 ENCOUNTER — Other Ambulatory Visit: Payer: Self-pay | Admitting: Family Medicine

## 2020-05-24 ENCOUNTER — Other Ambulatory Visit: Payer: Self-pay | Admitting: Family Medicine

## 2020-06-07 ENCOUNTER — Other Ambulatory Visit: Payer: Self-pay

## 2020-06-07 ENCOUNTER — Encounter: Payer: Self-pay | Admitting: Family Medicine

## 2020-06-07 ENCOUNTER — Ambulatory Visit (INDEPENDENT_AMBULATORY_CARE_PROVIDER_SITE_OTHER): Payer: BC Managed Care – PPO | Admitting: Family Medicine

## 2020-06-07 VITALS — BP 120/78 | HR 95 | Temp 97.6°F | Ht 73.0 in | Wt 246.6 lb

## 2020-06-07 DIAGNOSIS — Z0000 Encounter for general adult medical examination without abnormal findings: Secondary | ICD-10-CM

## 2020-06-07 DIAGNOSIS — E785 Hyperlipidemia, unspecified: Secondary | ICD-10-CM

## 2020-06-07 DIAGNOSIS — H8112 Benign paroxysmal vertigo, left ear: Secondary | ICD-10-CM

## 2020-06-07 NOTE — Patient Instructions (Signed)
Preventive Care 41-63 Years Old, Male Preventive care refers to lifestyle choices and visits with your health care provider that can promote health and wellness. This includes:  A yearly physical exam. This is also called an annual well check.  Regular dental and eye exams.  Immunizations.  Screening for certain conditions.  Healthy lifestyle choices, such as eating a healthy diet, getting regular exercise, not using drugs or products that contain nicotine and tobacco, and limiting alcohol use. What can I expect for my preventive care visit? Physical exam Your health care provider will check:  Height and weight. These may be used to calculate body mass index (BMI), which is a measurement that tells if you are at a healthy weight.  Heart rate and blood pressure.  Your skin for abnormal spots. Counseling Your health care provider may ask you questions about:  Alcohol, tobacco, and drug use.  Emotional well-being.  Home and relationship well-being.  Sexual activity.  Eating habits.  Work and work Statistician. What immunizations do I need?  Influenza (flu) vaccine  This is recommended every year. Tetanus, diphtheria, and pertussis (Tdap) vaccine  You may need a Td booster every 10 years. Varicella (chickenpox) vaccine  You may need this vaccine if you have not already been vaccinated. Zoster (shingles) vaccine  You may need this after age 63. Measles, mumps, and rubella (MMR) vaccine  You may need at least one dose of MMR if you were born in 1957 or later. You may also need a second dose. Pneumococcal conjugate (PCV13) vaccine  You may need this if you have certain conditions and were not previously vaccinated. Pneumococcal polysaccharide (PPSV23) vaccine  You may need one or two doses if you smoke cigarettes or if you have certain conditions. Meningococcal conjugate (MenACWY) vaccine  You may need this if you have certain conditions. Hepatitis A  vaccine  You may need this if you have certain conditions or if you travel or work in places where you may be exposed to hepatitis A. Hepatitis B vaccine  You may need this if you have certain conditions or if you travel or work in places where you may be exposed to hepatitis B. Haemophilus influenzae type b (Hib) vaccine  You may need this if you have certain risk factors. Human papillomavirus (HPV) vaccine  If recommended by your health care provider, you may need three doses over 6 months. You may receive vaccines as individual doses or as more than one vaccine together in one shot (combination vaccines). Talk with your health care provider about the risks and benefits of combination vaccines. What tests do I need? Blood tests  Lipid and cholesterol levels. These may be checked every 5 years, or more frequently if you are over 60 years old.  Hepatitis C test.  Hepatitis B test. Screening  Lung cancer screening. You may have this screening every year starting at age 63 if you have a 30-pack-year history of smoking and currently smoke or have quit within the past 15 years.  Prostate cancer screening. Recommendations will vary depending on your family history and other risks.  Colorectal cancer screening. All adults should have this screening starting at age 63 and continuing until age 2. Your health care provider may recommend screening at age 63 if you are at increased risk. You will have tests every 1-10 years, depending on your results and the type of screening test.  Diabetes screening. This is done by checking your blood sugar (glucose) after you have not eaten  for a while (fasting). You may have this done every 1-3 years.  Sexually transmitted disease (STD) testing. Follow these instructions at home: Eating and drinking  Eat a diet that includes fresh fruits and vegetables, whole grains, lean protein, and low-fat dairy products.  Take vitamin and mineral supplements as  recommended by your health care provider.  Do not drink alcohol if your health care provider tells you not to drink.  If you drink alcohol: ? Limit how much you have to 0-2 drinks a day. ? Be aware of how much alcohol is in your drink. In the [place] one drink equals one 12 oz bottle of beer (355 mL), one 5 oz glass of wine (148 mL), or one 1 oz glass of hard liquor (44 mL). Lifestyle  Take daily care of your teeth and gums.  Stay active. Exercise for at least 30 minutes on 5 or more days each week.  Do not use any products that contain nicotine or tobacco, such as cigarettes, e-cigarettes, and chewing tobacco. If you need help quitting, ask your health care provider.  If you are sexually active, practice safe sex. Use a condom or other form of protection to prevent STIs (sexually transmitted infections).  Talk with your health care provider about taking a low-dose aspirin every day starting at age 53. What's next?  Go to your health care provider once a year for a well check visit.  Ask your health care provider how often you should have your eyes and teeth checked.  Stay up to date on all vaccines. This information is not intended to replace advice given to you by your health care provider. Make sure you discuss any questions you have with your health care provider. Document Revised: 12/11/2018 Document Reviewed: 12/11/2018 Elsevier Patient Education  2020 [employer].

## 2020-06-07 NOTE — Progress Notes (Signed)
Subjective:     Patient ID: William English, male   DOB: [birth year], 64 y.o.   MRN: [id number]  HPI   Jacquelyn is seen for physical exam.  He has history of hyperlipidemia which is treated with Crestor.  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  He is married and has 2 daughters and 3 grandchildren.  He has had intermittent palpitations for years.  He has taken low-dose metoprolol as needed for that.  No recent chest pains.  He had a brother recently diagnosed with MS.  He has had vertigo frequently during the past several weeks.  This is consistently with turning head to the left.  No headaches.  No ataxia.  No focal weakness.  No speech changes.  No dysphagia.  Health maintenance reviewed.  He is all received Covid vaccines.  Will be due for repeat colonoscopy next year as well as tetanus booster.  Past Medical History:  Diagnosis Date  . Abdominal pain, left upper quadrant 09/02/2009   No past surgical history on file.  reports that he has never smoked. He has never used smokeless tobacco. He reports current alcohol use. He reports that he does not use drugs. family history includes Atrial fibrillation in his father and mother; Heart attack in his maternal grandfather, paternal grandfather, and paternal uncle; Heart disease in his father; Hyperlipidemia in his father and mother. No Known Allergies   Review of Systems  Constitutional: Negative for activity change, appetite change, fatigue and fever.  HENT: Negative for congestion, ear pain and trouble swallowing.   Eyes: Negative for pain and visual disturbance.  Respiratory: Negative for cough, shortness of breath and wheezing.   Cardiovascular: Negative for chest pain and palpitations.  Gastrointestinal: Negative for abdominal distention, abdominal pain, blood in stool, constipation, diarrhea, nausea, rectal pain and vomiting.  Endocrine: Negative for polydipsia and polyuria.  Genitourinary: Negative for dysuria, hematuria and  testicular pain.  Musculoskeletal: Negative for arthralgias and joint swelling.  Skin: Negative for rash.  Neurological: Negative for dizziness, syncope and headaches.  Hematological: Negative for adenopathy.  Psychiatric/Behavioral: Negative for confusion and dysphoric mood.       Objective:   Physical Exam Constitutional:      General: He is not in acute distress.    Appearance: He is well-developed.  HENT:     Head: Normocephalic and atraumatic.     Right Ear: External ear normal.     Left Ear: External ear normal.  Eyes:     Conjunctiva/sclera: Conjunctivae normal.     Pupils: Pupils are equal, round, and reactive to light.  Neck:     Thyroid: No thyromegaly.  Cardiovascular:     Rate and Rhythm: Normal rate and regular rhythm.     Heart sounds: Normal heart sounds. No murmur.  Pulmonary:     Effort: No respiratory distress.     Breath sounds: No wheezing or rales.  Abdominal:     General: Bowel sounds are normal. There is no distension.     Palpations: Abdomen is soft. There is no mass.     Tenderness: There is no abdominal tenderness. There is no guarding or rebound.  Musculoskeletal:     Cervical back: Normal range of motion and neck supple.  Lymphadenopathy:     Cervical: No cervical adenopathy.  Skin:    Findings: No rash.  Neurological:     Mental Status: He is alert and oriented to person, place, and time.     Cranial Nerves: No  cranial nerve deficit.     Deep Tendon Reflexes: Reflexes normal.        Assessment:     Physical exam.  History of hyperlipidemia treated with Crestor.  He is describing some intermittent vertigo symptoms to the left consistent with likely benign peripheral positional vertigo to the left    Plan:     -Discussed trying Epley maneuvers with handout given I be in touch with these or not resolving his vertigo after a few days  -Future labs ordered as we did not have any phlebotomist in the lab today.  He will schedule  these  -We will need repeat colonoscopy and tetanus booster by next year  -Covid vaccines already given  -Shingrix already given  Eulas Post MD [facility]

## 2020-06-22 ENCOUNTER — Other Ambulatory Visit: Payer: Self-pay | Admitting: Family Medicine

## 2020-06-22 ENCOUNTER — Other Ambulatory Visit: Payer: Self-pay

## 2020-06-23 ENCOUNTER — Other Ambulatory Visit (INDEPENDENT_AMBULATORY_CARE_PROVIDER_SITE_OTHER): Payer: BC Managed Care – PPO

## 2020-06-23 DIAGNOSIS — Z125 Encounter for screening for malignant neoplasm of prostate: Secondary | ICD-10-CM | POA: Diagnosis not present

## 2020-06-23 DIAGNOSIS — Z0000 Encounter for general adult medical examination without abnormal findings: Secondary | ICD-10-CM

## 2020-06-23 LAB — CBC WITH DIFFERENTIAL/PLATELET
Basophils Absolute: 0 10*3/uL (ref 0.0–0.1)
Basophils Relative: 0.8 % (ref 0.0–3.0)
Eosinophils Absolute: 0.1 10*3/uL (ref 0.0–0.7)
Eosinophils Relative: 2.3 % (ref 0.0–5.0)
HCT: 46.9 % (ref 39.0–52.0)
Hemoglobin: 15.5 g/dL (ref 13.0–17.0)
Lymphocytes Relative: 34.3 % (ref 12.0–46.0)
Lymphs Abs: 1.8 10*3/uL (ref 0.7–4.0)
MCHC: 33.1 g/dL (ref 30.0–36.0)
MCV: 92.3 fl (ref 78.0–100.0)
Monocytes Absolute: 0.5 10*3/uL (ref 0.1–1.0)
Monocytes Relative: 10.4 % (ref 3.0–12.0)
Neutro Abs: 2.8 10*3/uL (ref 1.4–7.7)
Neutrophils Relative %: 52.2 % (ref 43.0–77.0)
Platelets: 172 10*3/uL (ref 150.0–400.0)
RBC: 5.08 Mil/uL (ref 4.22–5.81)
RDW: 13.2 % (ref 11.5–15.5)
WBC: 5.3 10*3/uL (ref 4.0–10.5)

## 2020-06-23 LAB — LIPID PANEL
Cholesterol: 201 mg/dL — ABNORMAL HIGH (ref 0–200)
HDL: 37.8 mg/dL — ABNORMAL LOW (ref >39.00)
LDL Cholesterol: 128 mg/dL — ABNORMAL HIGH (ref 0–99)
NonHDL: 163.65
Total CHOL/HDL Ratio: 5
Triglycerides: 179 mg/dL — ABNORMAL HIGH (ref 0.0–149.0)
VLDL: 35.8 mg/dL (ref 0.0–40.0)

## 2020-06-23 LAB — HEPATIC FUNCTION PANEL
ALT: 33 U/L (ref 0–53)
AST: 24 U/L (ref 0–37)
Albumin: 4.3 g/dL (ref 3.5–5.2)
Alkaline Phosphatase: 65 U/L (ref 39–117)
Bilirubin, Direct: 0.1 mg/dL (ref 0.0–0.3)
Total Bilirubin: 0.5 mg/dL (ref 0.2–1.2)
Total Protein: 6.3 g/dL (ref 6.0–8.3)

## 2020-06-23 LAB — BASIC METABOLIC PANEL
BUN: 21 mg/dL (ref 6–23)
CO2: 30 mEq/L (ref 19–32)
Calcium: 9.4 mg/dL (ref 8.4–10.5)
Chloride: 103 mEq/L (ref 96–112)
Creatinine, Ser: 1.06 mg/dL (ref 0.40–1.50)
GFR: 70.63 mL/min (ref >60.00)
Glucose, Bld: 91 mg/dL (ref 70–99)
Potassium: 3.9 mEq/L (ref 3.5–5.1)
Sodium: 138 mEq/L (ref 135–145)

## 2020-06-23 LAB — TSH: TSH: 1.12 u[IU]/mL (ref 0.35–4.50)

## 2020-06-23 LAB — PSA: PSA: 0.42 ng/mL (ref 0.10–4.00)

## 2020-06-23 NOTE — Progress Notes (Signed)
l °

## 2021-04-04 DIAGNOSIS — H0014 Chalazion left upper eyelid: Secondary | ICD-10-CM | POA: Diagnosis not present

## 2021-08-29 ENCOUNTER — Other Ambulatory Visit: Payer: Self-pay | Admitting: Family Medicine

## 2021-09-08 ENCOUNTER — Other Ambulatory Visit: Payer: Self-pay

## 2021-09-08 ENCOUNTER — Ambulatory Visit (INDEPENDENT_AMBULATORY_CARE_PROVIDER_SITE_OTHER): Payer: BC Managed Care – PPO | Admitting: Family Medicine

## 2021-09-08 ENCOUNTER — Encounter: Payer: Self-pay | Admitting: Family Medicine

## 2021-09-08 VITALS — BP 130/80 | HR 70 | Temp 98.2°F | Wt 246.6 lb

## 2021-09-08 DIAGNOSIS — Z23 Encounter for immunization: Secondary | ICD-10-CM

## 2021-09-08 DIAGNOSIS — Z0000 Encounter for general adult medical examination without abnormal findings: Secondary | ICD-10-CM | POA: Diagnosis not present

## 2021-09-08 LAB — CBC WITH DIFFERENTIAL/PLATELET
Basophils Absolute: 0 10*3/uL (ref 0.0–0.1)
Basophils Relative: 0.7 % (ref 0.0–3.0)
Eosinophils Absolute: 0.1 10*3/uL (ref 0.0–0.7)
Eosinophils Relative: 1.8 % (ref 0.0–5.0)
HCT: 47 % (ref 39.0–52.0)
Hemoglobin: 15.7 g/dL (ref 13.0–17.0)
Lymphocytes Relative: 27.6 % (ref 12.0–46.0)
Lymphs Abs: 1.9 10*3/uL (ref 0.7–4.0)
MCHC: 33.3 g/dL (ref 30.0–36.0)
MCV: 91.5 fl (ref 78.0–100.0)
Monocytes Absolute: 0.7 10*3/uL (ref 0.1–1.0)
Monocytes Relative: 10.7 % (ref 3.0–12.0)
Neutro Abs: 4 10*3/uL (ref 1.4–7.7)
Neutrophils Relative %: 59.2 % (ref 43.0–77.0)
Platelets: 188 10*3/uL (ref 150.0–400.0)
RBC: 5.14 Mil/uL (ref 4.22–5.81)
RDW: 13.2 % (ref 11.5–15.5)
WBC: 6.7 10*3/uL (ref 4.0–10.5)

## 2021-09-08 LAB — HEPATIC FUNCTION PANEL
ALT: 27 U/L (ref 0–53)
AST: 20 U/L (ref 0–37)
Albumin: 4.2 g/dL (ref 3.5–5.2)
Alkaline Phosphatase: 65 U/L (ref 39–117)
Bilirubin, Direct: 0.1 mg/dL (ref 0.0–0.3)
Total Bilirubin: 0.7 mg/dL (ref 0.2–1.2)
Total Protein: 6.7 g/dL (ref 6.0–8.3)

## 2021-09-08 LAB — LIPID PANEL
Cholesterol: 184 mg/dL (ref 0–200)
HDL: 38.3 mg/dL — ABNORMAL LOW (ref >39.00)
LDL Cholesterol: 112 mg/dL — ABNORMAL HIGH (ref 0–99)
NonHDL: 145.87
Total CHOL/HDL Ratio: 5
Triglycerides: 168 mg/dL — ABNORMAL HIGH (ref 0.0–149.0)
VLDL: 33.6 mg/dL (ref 0.0–40.0)

## 2021-09-08 LAB — BASIC METABOLIC PANEL
BUN: 17 mg/dL (ref 6–23)
CO2: 30 mEq/L (ref 19–32)
Calcium: 9.4 mg/dL (ref 8.4–10.5)
Chloride: 103 mEq/L (ref 96–112)
Creatinine, Ser: 1.04 mg/dL (ref 0.40–1.50)
GFR: 76.2 mL/min (ref >60.00)
Glucose, Bld: 86 mg/dL (ref 70–99)
Potassium: 4.2 mEq/L (ref 3.5–5.1)
Sodium: 140 mEq/L (ref 135–145)

## 2021-09-08 LAB — TSH: TSH: 1 u[IU]/mL (ref 0.35–5.50)

## 2021-09-08 LAB — PSA: PSA: 0.6 ng/mL (ref 0.10–4.00)

## 2021-09-08 NOTE — Progress Notes (Signed)
Established Patient Office Visit  Subjective:  Patient ID: William English, male    DOB: 1956-07-10  Age: 65 y.o. MRN: [id number]  CC:  Chief Complaint  Patient presents with   Annual Exam    No new concerns     HPI William English presents for physical exam.  Generally doing well.  He continues to exercise regularly.  He has some ongoing mild palpitations and notes that these are usually related to caffeine consumption.  He is due for repeat colonoscopy this year.  Also needs tetanus booster and flu vaccine.  Other health maintenance up-to-date.  Family history reviewed-his dad passed away of complications of Alzheimer's dementia earlier this year.  He was in his 82s at onset.  Mother and father had history of hypertension and atrial fibrillation. He has a brother with MS  Social history-married.  Non-smoker.  2 daughters and he has 4 grandsons.  Past Medical History:  Diagnosis Date   Abdominal pain, left upper quadrant 09/02/2009    No past surgical history on file.  Family History  Problem Relation Age of Onset   Atrial fibrillation Mother    Hyperlipidemia Mother    Heart disease Father        a fib   Atrial fibrillation Father    Hyperlipidemia Father    Dementia Father    Multiple sclerosis Brother    Heart attack Paternal Uncle    Heart attack Maternal Grandfather    Heart attack Paternal Grandfather     Social History   Socioeconomic History   Marital status: Married    Spouse name: Not on file   Number of children: Not on file   Years of education: Not on file   Highest education level: Not on file  Occupational History   Not on file  Tobacco Use   Smoking status: Never   Smokeless tobacco: Never  Substance and Sexual Activity   Alcohol use: Yes    Comment: occ glass of wine with dinner   Drug use: No   Sexual activity: Not on file  Other Topics Concern   Not on file  Social History Narrative   Not on file   Social Determinants of Health   Financial  Resource Strain: Not on file  Food Insecurity: Not on file  Transportation Needs: Not on file  Physical Activity: Not on file  Stress: Not on file  Social Connections: Not on file  Intimate Partner Violence: Not on file    Outpatient Medications Prior to Visit  Medication Sig Dispense Refill   Ascorbic Acid (VITAMIN C) 100 MG tablet Take 100 mg by mouth daily.     aspirin 81 MG tablet Take 81 mg by mouth daily. Takes 1-2 times per week.     cyclobenzaprine (FLEXERIL) 10 MG tablet Take 1 tablet (10 mg total) by mouth 3 (three) times daily as needed for muscle spasms. 30 tablet 0   glucosamine-chondroitin 500-400 MG tablet Take 1 tablet by mouth daily.     methocarbamol (ROBAXIN) 500 MG tablet Take 1 tablet (500 mg total) by mouth every 6 (six) hours as needed for muscle spasms. 30 tablet 0   metoprolol tartrate (LOPRESSOR) 25 MG tablet TAKE ONE DAILY AS NEEDED FOR SEVERE PALPITATIONS. 90 tablet 0   Multiple Vitamins-Minerals (MULTIVITAMIN WITH MINERALS) tablet Take 1 tablet by mouth daily.     omeprazole (PRILOSEC) 20 MG capsule Take 20 mg by mouth as needed.      rosuvastatin (CRESTOR) 10 MG  tablet TAKE 1 TABLET BY MOUTH EVERY DAY 90 tablet 0   traMADol (ULTRAM) 50 MG tablet Take one to two every 6 hours as needed. 40 tablet 0   Turmeric (QC TUMERIC COMPLEX PO) Take 750 mg by mouth.     atovaquone-proguanil (MALARONE) 250-100 MG TABS tablet Take one tablet daily starting one day prior to travel, during travel, and for one week after return. 16 tablet 0   predniSONE (DELTASONE) 10 MG tablet Taper as follows: 5-5-4-4-3-3-2-2-1-1 30 tablet 0   typhoid (VIVOTIF) DR capsule Take 1 capsule by mouth every other day. 4 capsule 0   No facility-administered medications prior to visit.    No Known Allergies  ROS Review of Systems  Constitutional:  Negative for activity change, appetite change, fatigue and fever.  HENT:  Negative for congestion, ear pain and trouble swallowing.   Eyes:   Negative for pain and visual disturbance.  Respiratory:  Negative for cough, shortness of breath and wheezing.   Cardiovascular:  Negative for chest pain and palpitations.  Gastrointestinal:  Negative for abdominal distention, abdominal pain, blood in stool, constipation, diarrhea, nausea, rectal pain and vomiting.  Genitourinary:  Negative for dysuria, hematuria and testicular pain.  Musculoskeletal:  Negative for arthralgias and joint swelling.  Skin:  Negative for rash.  Neurological:  Negative for dizziness, syncope and headaches.  Hematological:  Negative for adenopathy.  Psychiatric/Behavioral:  Negative for confusion and dysphoric mood.      Objective:    Physical Exam Constitutional:      General: He is not in acute distress.    Appearance: He is well-developed.  HENT:     Head: Normocephalic and atraumatic.     Right Ear: External ear normal.     Left Ear: External ear normal.  Eyes:     Conjunctiva/sclera: Conjunctivae normal.     Pupils: Pupils are equal, round, and reactive to light.  Neck:     Thyroid: No thyromegaly.  Cardiovascular:     Rate and Rhythm: Normal rate and regular rhythm.     Heart sounds: Normal heart sounds. No murmur heard. Pulmonary:     Effort: No respiratory distress.     Breath sounds: No wheezing or rales.  Abdominal:     General: Bowel sounds are normal. There is no distension.     Palpations: Abdomen is soft. There is no mass.     Tenderness: There is no abdominal tenderness. There is no guarding or rebound.  Musculoskeletal:     Cervical back: Normal range of motion and neck supple.     Right lower leg: No edema.     Left lower leg: No edema.  Lymphadenopathy:     Cervical: No cervical adenopathy.  Skin:    Findings: No rash.  Neurological:     Mental Status: He is alert and oriented to person, place, and time.     Cranial Nerves: No cranial nerve deficit.     Deep Tendon Reflexes: Reflexes normal.    BP 130/80 (BP Location:  Left Arm, Cuff Size: Normal)   Pulse 70   Temp 98.2 F (36.8 C) (Oral)   Wt 246 lb 9.6 oz (111.9 kg)   SpO2 98%   BMI 32.53 kg/m  Wt Readings from Last 3 Encounters:  09/08/21 246 lb 9.6 oz (111.9 kg)  06/07/20 246 lb 9.6 oz (111.9 kg)  07/01/18 250 lb (113.4 kg)     Health Maintenance Due  Topic Date Due   Pneumococcal Vaccine 0-64  Years old (1 - PCV) Never done   COVID-19 Vaccine (3 - Pfizer risk series) 05/10/2020    There are no preventive care reminders to display for this patient.  Lab Results  Component Value Date   TSH 1.12 06/23/2020   Lab Results  Component Value Date   WBC 5.3 06/23/2020   HGB 15.5 06/23/2020   HCT 46.9 06/23/2020   MCV 92.3 06/23/2020   PLT 172.0 06/23/2020   Lab Results  Component Value Date   NA 138 06/23/2020   K 3.9 06/23/2020   CO2 30 06/23/2020   GLUCOSE 91 06/23/2020   BUN 21 06/23/2020   CREATININE 1.06 06/23/2020   BILITOT 0.5 06/23/2020   ALKPHOS 65 06/23/2020   AST 24 06/23/2020   ALT 33 06/23/2020   PROT 6.3 06/23/2020   ALBUMIN 4.3 06/23/2020   CALCIUM 9.4 06/23/2020   GFR 70.63 06/23/2020   Lab Results  Component Value Date   CHOL 201 (H) 06/23/2020   Lab Results  Component Value Date   HDL 37.80 (L) 06/23/2020   Lab Results  Component Value Date   LDLCALC 128 (H) 06/23/2020   Lab Results  Component Value Date   TRIG 179.0 (H) 06/23/2020   Lab Results  Component Value Date   CHOLHDL 5 06/23/2020   No results found for: HGBA1C    Assessment & Plan:   Problem List Items Addressed This Visit   None Visit Diagnoses     Physical exam    -  Primary   Relevant Orders   Basic metabolic panel   Lipid panel   CBC with Differential/Platelet   TSH   Hepatic function panel   PSA     Generally healthy 65 year old male.  Initial blood pressure up today but repeat left arm seated after rest with large cuff 130/80.  We discussed the following health maintenance issues  -Flu vaccine and tetanus  given -He will call to set up repeat colonoscopy which is due later this year -Continue regular exercise habits -Obtain screening labs as above including PSA -He plans to get bivalent COVID-vaccine when available  No orders of the defined types were placed in this encounter.   Follow-up: No follow-ups on file.    Carolann Littler, MD

## 2021-11-15 ENCOUNTER — Telehealth (INDEPENDENT_AMBULATORY_CARE_PROVIDER_SITE_OTHER): Payer: BC Managed Care – PPO | Admitting: Family Medicine

## 2021-11-15 ENCOUNTER — Other Ambulatory Visit: Payer: Self-pay

## 2021-11-15 DIAGNOSIS — R058 Other specified cough: Secondary | ICD-10-CM

## 2021-11-15 MED ORDER — AMOXICILLIN-POT CLAVULANATE 875-125 MG PO TABS: 1.0000 | ORAL_TABLET | Freq: Two times a day (BID) | ORAL | 0 refills | Status: DC

## 2021-11-15 MED ORDER — PREDNISONE 20 MG PO TABS: ORAL_TABLET | ORAL | 0 refills | Status: DC

## 2021-11-15 NOTE — Progress Notes (Signed)
Patient ID: William English, male   DOB: Dec 22, 1956, 65 y.o.   MRN: [id number]  This visit type was conducted due to national recommendations for restrictions regarding the COVID-19 pandemic in an effort to limit this patient's exposure and mitigate transmission in our community.   Virtual Visit via Video Note  I connected with William English on 11/15/21 at 10:00 AM EST by a video enabled telemedicine application and verified that I am speaking with the correct person using two identifiers.  Location patient: home Location provider:work or home office Persons participating in the virtual visit: patient, provider  I discussed the limitations of evaluation and management by telemedicine and the availability of in person appointments. The patient expressed understanding and agreed to proceed.   HPI:  Onset of respiratory symptoms about 24 days ago.  His wife had similar symptoms but she is recovered at this point.  He had initial severe sore throat and cough.  Cough now productive of greenish sputum.  Does have some ongoing congestion.  Still exercising.  No fever.  Feels he may be wheezing some at night.  Has taken over-the-counter Mucinex without any improvement.  No dyspnea.  Denies any nausea, vomiting, or diarrhea.  He is generally very healthy.  No chronic lung disease.  Non-smoker.  ROS: See pertinent positives and negatives per HPI.  Past Medical History:  Diagnosis Date   Abdominal pain, left upper quadrant 09/02/2009    No past surgical history on file.  Family History  Problem Relation Age of Onset   Atrial fibrillation Mother    Hyperlipidemia Mother    Heart disease Father        a fib   Atrial fibrillation Father    Hyperlipidemia Father    Dementia Father    Multiple sclerosis Brother    Heart attack Paternal Uncle    Heart attack Maternal Grandfather    Heart attack Paternal Grandfather     SOCIAL HX: Non-smoker   Current Outpatient Medications:    amoxicillin-clavulanate  (AUGMENTIN) 875-125 MG tablet, Take 1 tablet by mouth 2 (two) times daily., Disp: 14 tablet, Rfl: 0   Ascorbic Acid (VITAMIN C) 100 MG tablet, Take 100 mg by mouth daily., Disp: , Rfl:    aspirin 81 MG tablet, Take 81 mg by mouth daily. Takes 1-2 times per week., Disp: , Rfl:    cyclobenzaprine (FLEXERIL) 10 MG tablet, Take 1 tablet (10 mg total) by mouth 3 (three) times daily as needed for muscle spasms., Disp: 30 tablet, Rfl: 0   glucosamine-chondroitin 500-400 MG tablet, Take 1 tablet by mouth daily., Disp: , Rfl:    methocarbamol (ROBAXIN) 500 MG tablet, Take 1 tablet (500 mg total) by mouth every 6 (six) hours as needed for muscle spasms., Disp: 30 tablet, Rfl: 0   metoprolol tartrate (LOPRESSOR) 25 MG tablet, TAKE ONE DAILY AS NEEDED FOR SEVERE PALPITATIONS., Disp: 90 tablet, Rfl: 0   Multiple Vitamins-Minerals (MULTIVITAMIN WITH MINERALS) tablet, Take 1 tablet by mouth daily., Disp: , Rfl:    omeprazole (PRILOSEC) 20 MG capsule, Take 20 mg by mouth as needed. , Disp: , Rfl:    predniSONE (DELTASONE) 20 MG tablet, Take two tablets by mouth once daily for 5 days, Disp: 10 tablet, Rfl: 0   rosuvastatin (CRESTOR) 10 MG tablet, TAKE 1 TABLET BY MOUTH EVERY DAY, Disp: 90 tablet, Rfl: 0   traMADol (ULTRAM) 50 MG tablet, Take one to two every 6 hours as needed., Disp: 40 tablet, Rfl: 0   Turmeric (  QC TUMERIC COMPLEX PO), Take 750 mg by mouth., Disp: , Rfl:   EXAM:  Na  ASSESSMENT AND PLAN:  Discussed the following assessment and plan:  Persistent upper respiratory symptoms of productive cough, intermittent wheezing  Suspect this started viral.  Question superimposed bacterial sinusitis. -Start Augmentin 875 mg twice daily for 7 days -Prednisone 20 mg 2 tablets daily for 5 days -Office followed by next week to further assess if not improving by then     I discussed the assessment and treatment plan with the patient. The patient was provided an opportunity to ask questions and all were  answered. The patient agreed with the plan and demonstrated an understanding of the instructions.   The patient was advised to call back or seek an in-person evaluation if the symptoms worsen or if the condition fails to improve as anticipated.     Carolann Littler, MD

## 2021-11-26 ENCOUNTER — Other Ambulatory Visit: Payer: Self-pay | Admitting: Family Medicine

## 2021-12-13 DIAGNOSIS — K219 Gastro-esophageal reflux disease without esophagitis: Secondary | ICD-10-CM | POA: Diagnosis not present

## 2021-12-13 DIAGNOSIS — E782 Mixed hyperlipidemia: Secondary | ICD-10-CM | POA: Diagnosis not present

## 2021-12-13 DIAGNOSIS — Z1211 Encounter for screening for malignant neoplasm of colon: Secondary | ICD-10-CM | POA: Diagnosis not present

## 2022-01-23 ENCOUNTER — Encounter: Payer: Self-pay | Admitting: Family Medicine

## 2022-01-23 DIAGNOSIS — K635 Polyp of colon: Secondary | ICD-10-CM | POA: Diagnosis not present

## 2022-01-23 DIAGNOSIS — D123 Benign neoplasm of transverse colon: Secondary | ICD-10-CM | POA: Diagnosis not present

## 2022-01-23 DIAGNOSIS — Z1211 Encounter for screening for malignant neoplasm of colon: Secondary | ICD-10-CM | POA: Diagnosis not present

## 2022-02-28 ENCOUNTER — Other Ambulatory Visit: Payer: Self-pay | Admitting: Family Medicine

## 2022-08-24 ENCOUNTER — Other Ambulatory Visit: Payer: Self-pay | Admitting: Family Medicine

## 2022-09-19 ENCOUNTER — Other Ambulatory Visit: Payer: Self-pay | Admitting: Family Medicine

## 2022-10-05 DIAGNOSIS — H25813 Combined forms of age-related cataract, bilateral: Secondary | ICD-10-CM | POA: Diagnosis not present

## 2022-10-05 DIAGNOSIS — H4423 Degenerative myopia, bilateral: Secondary | ICD-10-CM | POA: Diagnosis not present

## 2022-10-05 DIAGNOSIS — H33311 Horseshoe tear of retina without detachment, right eye: Secondary | ICD-10-CM | POA: Diagnosis not present

## 2022-10-05 DIAGNOSIS — H35372 Puckering of macula, left eye: Secondary | ICD-10-CM | POA: Diagnosis not present

## 2022-10-10 ENCOUNTER — Ambulatory Visit (INDEPENDENT_AMBULATORY_CARE_PROVIDER_SITE_OTHER): Payer: Medicare Other | Admitting: Family Medicine

## 2022-10-10 ENCOUNTER — Encounter: Payer: Self-pay | Admitting: Family Medicine

## 2022-10-10 VITALS — BP 116/70 | HR 63 | Temp 97.9°F | Ht 72.24 in | Wt 243.1 lb

## 2022-10-10 DIAGNOSIS — Z0000 Encounter for general adult medical examination without abnormal findings: Secondary | ICD-10-CM

## 2022-10-10 DIAGNOSIS — Z23 Encounter for immunization: Secondary | ICD-10-CM | POA: Diagnosis not present

## 2022-10-10 LAB — CBC WITH DIFFERENTIAL/PLATELET
Basophils Absolute: 0.1 10*3/uL (ref 0.0–0.1)
Basophils Relative: 0.7 % (ref 0.0–3.0)
Eosinophils Absolute: 0.1 10*3/uL (ref 0.0–0.7)
Eosinophils Relative: 1.8 % (ref 0.0–5.0)
HCT: 47 % (ref 39.0–52.0)
Hemoglobin: 15.5 g/dL (ref 13.0–17.0)
Lymphocytes Relative: 27.8 % (ref 12.0–46.0)
Lymphs Abs: 1.9 10*3/uL (ref 0.7–4.0)
MCHC: 33 g/dL (ref 30.0–36.0)
MCV: 92.5 fl (ref 78.0–100.0)
Monocytes Absolute: 0.8 10*3/uL (ref 0.1–1.0)
Monocytes Relative: 11.3 % (ref 3.0–12.0)
Neutro Abs: 4 10*3/uL (ref 1.4–7.7)
Neutrophils Relative %: 58.4 % (ref 43.0–77.0)
Platelets: 201 10*3/uL (ref 150.0–400.0)
RBC: 5.08 Mil/uL (ref 4.22–5.81)
RDW: 13 % (ref 11.5–15.5)
WBC: 6.8 10*3/uL (ref 4.0–10.5)

## 2022-10-10 LAB — BASIC METABOLIC PANEL
BUN: 17 mg/dL (ref 6–23)
CO2: 31 mEq/L (ref 19–32)
Calcium: 9.4 mg/dL (ref 8.4–10.5)
Chloride: 103 mEq/L (ref 96–112)
Creatinine, Ser: 0.96 mg/dL (ref 0.40–1.50)
GFR: 83.25 mL/min (ref >60.00)
Glucose, Bld: 87 mg/dL (ref 70–99)
Potassium: 4.3 mEq/L (ref 3.5–5.1)
Sodium: 139 mEq/L (ref 135–145)

## 2022-10-10 LAB — HEPATIC FUNCTION PANEL
ALT: 26 U/L (ref 0–53)
AST: 23 U/L (ref 0–37)
Albumin: 4.2 g/dL (ref 3.5–5.2)
Alkaline Phosphatase: 69 U/L (ref 39–117)
Bilirubin, Direct: 0.1 mg/dL (ref 0.0–0.3)
Total Bilirubin: 0.8 mg/dL (ref 0.2–1.2)
Total Protein: 6.7 g/dL (ref 6.0–8.3)

## 2022-10-10 LAB — LIPID PANEL
Cholesterol: 192 mg/dL (ref 0–200)
HDL: 44.1 mg/dL (ref >39.00)
LDL Cholesterol: 121 mg/dL — ABNORMAL HIGH (ref 0–99)
NonHDL: 147.46
Total CHOL/HDL Ratio: 4
Triglycerides: 134 mg/dL (ref 0.0–149.0)
VLDL: 26.8 mg/dL (ref 0.0–40.0)

## 2022-10-10 LAB — PSA: PSA: 0.5 ng/mL (ref 0.10–4.00)

## 2022-10-10 LAB — TSH: TSH: 1.19 u[IU]/mL (ref 0.35–5.50)

## 2022-10-10 MED ORDER — CYCLOBENZAPRINE HCL 10 MG PO TABS: 10.0000 mg | ORAL_TABLET | Freq: Three times a day (TID) | ORAL | 0 refills | Status: DC | PRN

## 2022-10-10 MED ORDER — METOPROLOL TARTRATE 25 MG PO TABS: ORAL_TABLET | ORAL | 0 refills | Status: DC

## 2022-10-10 NOTE — Addendum Note (Signed)
Addended by: Nilda Riggs on: 10/10/2022 10:27 AM   Modules accepted: Orders

## 2022-10-10 NOTE — Progress Notes (Signed)
Established Patient Office Visit  Subjective   Patient ID: Suleyman Ehrman, male    DOB: 05/12/1956  Age: 66 y.o. MRN: [id number]  Chief Complaint  Patient presents with   Annual Exam    HPI   William English is here for physical exam.  He has history of hyperlipidemia treated with rosuvastatin.  He has history of intermittent palpitations and takes metoprolol as needed.  Does need refills of metoprolol.  Generally doing well.  Health maintenance reviewed  -Need flu vaccine -Also would like to go ahead with COVID booster. -Previous hepatitis C screen negative -Shingrix vaccine completed -Had colonoscopy 1-23 with recommended 7-year follow-up -Tetanus due 2032  Social history-he is married.  Has 2 daughters and 4 grandsons with one on the way.  He just retired a couple months ago.  Non-smoker.  Family history-mother and father are deceased.  His mother had complication of kidney failure and apparently had hypercalcemia of uncertain etiology.  Both mother and father had hypertension.  Father died age 55 after diagnosis of Alzheimer disease around age 50.  He has a brother with MS.  Past Medical History:  Diagnosis Date   Abdominal pain, left upper quadrant 09/02/2009   History reviewed. No pertinent surgical history.  reports that he has never smoked. He has never used smokeless tobacco. He reports current alcohol use. He reports that he does not use drugs. family history includes Atrial fibrillation in his father and mother; Dementia in his father; Heart attack in his maternal grandfather, paternal grandfather, and paternal uncle; Heart disease in his father; Hyperlipidemia in his father and mother; Multiple sclerosis in his brother. No Known Allergies  Review of Systems  Constitutional:  Negative for chills, fever, malaise/fatigue and weight loss.  HENT:  Negative for hearing loss.   Eyes:  Negative for blurred vision and double vision.  Respiratory:  Negative for cough and shortness of  breath.   Cardiovascular:  Negative for chest pain, palpitations and leg swelling.  Gastrointestinal:  Negative for abdominal pain, blood in stool, constipation and diarrhea.  Genitourinary:  Negative for dysuria.  Skin:  Negative for rash.  Neurological:  Negative for dizziness, speech change, seizures, loss of consciousness and headaches.  Psychiatric/Behavioral:  Negative for depression.       Objective:     BP 116/70 (BP Location: Left Arm, Patient Position: Sitting, Cuff Size: Large)   Pulse 63   Temp 97.9 F (36.6 C) (Oral)   Ht 6' 0.24" (1.835 m)   Wt 243 lb 1.6 oz (110.3 kg)   SpO2 99%   BMI 32.75 kg/m    Physical Exam Vitals reviewed.  Constitutional:      General: He is not in acute distress.    Appearance: He is well-developed.  HENT:     Head: Normocephalic and atraumatic.     Right Ear: External ear normal.     Left Ear: External ear normal.  Eyes:     Conjunctiva/sclera: Conjunctivae normal.     Pupils: Pupils are equal, round, and reactive to light.  Neck:     Thyroid: No thyromegaly.  Cardiovascular:     Rate and Rhythm: Normal rate and regular rhythm.     Heart sounds: Normal heart sounds. No murmur heard. Pulmonary:     Effort: No respiratory distress.     Breath sounds: No wheezing or rales.  Abdominal:     General: Bowel sounds are normal. There is no distension.     Palpations: Abdomen is soft. There  is no mass.     Tenderness: There is no abdominal tenderness. There is no guarding or rebound.  Musculoskeletal:     Cervical back: Normal range of motion and neck supple.     Right lower leg: No edema.     Left lower leg: No edema.  Lymphadenopathy:     Cervical: No cervical adenopathy.  Skin:    Findings: No rash.  Neurological:     Mental Status: He is alert and oriented to person, place, and time.     Cranial Nerves: No cranial nerve deficit.      No results found for any visits on 10/10/22.    The 10-year ASCVD risk score  (Arnett DK, et al., 2019) is: 11.1%    Assessment & Plan:   Problem List Items Addressed This Visit   None Visit Diagnoses     Physical exam    -  Primary   Relevant Orders   Basic metabolic panel   Lipid panel   CBC with Differential/Platelet   TSH   Hepatic function panel   PSA     Healthy 66 year old male.  We discussed the following health maintenance items  -Flu vaccine given -COVID booster given -Continue regular exercise habits -Discussed sun protection -Other vaccines as above up-to-date  No follow-ups on file.    Carolann Littler, MD

## 2022-10-12 ENCOUNTER — Telehealth: Payer: Self-pay | Admitting: Family Medicine

## 2022-10-12 NOTE — Telephone Encounter (Signed)
Spoke with patient about results.

## 2022-10-12 NOTE — Telephone Encounter (Signed)
Pt returned call from care team regarding lab results

## 2022-10-17 ENCOUNTER — Other Ambulatory Visit: Payer: Self-pay | Admitting: Family Medicine

## 2022-11-06 DIAGNOSIS — H33322 Round hole, left eye: Secondary | ICD-10-CM | POA: Diagnosis not present

## 2022-12-18 DIAGNOSIS — H25813 Combined forms of age-related cataract, bilateral: Secondary | ICD-10-CM | POA: Diagnosis not present

## 2022-12-18 DIAGNOSIS — H33311 Horseshoe tear of retina without detachment, right eye: Secondary | ICD-10-CM | POA: Diagnosis not present

## 2022-12-18 DIAGNOSIS — H33322 Round hole, left eye: Secondary | ICD-10-CM | POA: Diagnosis not present

## 2022-12-31 ENCOUNTER — Other Ambulatory Visit: Payer: Self-pay | Admitting: Family Medicine

## 2023-03-15 ENCOUNTER — Encounter: Payer: Self-pay | Admitting: Family Medicine

## 2023-03-15 ENCOUNTER — Ambulatory Visit (INDEPENDENT_AMBULATORY_CARE_PROVIDER_SITE_OTHER): Payer: Medicare Other | Admitting: Family Medicine

## 2023-03-15 VITALS — BP 110/70 | HR 67 | Temp 98.4°F | Ht 72.0 in | Wt 247.9 lb

## 2023-03-15 DIAGNOSIS — Z23 Encounter for immunization: Secondary | ICD-10-CM

## 2023-03-15 DIAGNOSIS — L989 Disorder of the skin and subcutaneous tissue, unspecified: Secondary | ICD-10-CM | POA: Diagnosis not present

## 2023-03-15 DIAGNOSIS — Z0000 Encounter for general adult medical examination without abnormal findings: Secondary | ICD-10-CM

## 2023-03-15 LAB — BASIC METABOLIC PANEL
BUN: 26 mg/dL — ABNORMAL HIGH (ref 6–23)
CO2: 27 mEq/L (ref 19–32)
Calcium: 9.4 mg/dL (ref 8.4–10.5)
Chloride: 105 mEq/L (ref 96–112)
Creatinine, Ser: 1.06 mg/dL (ref 0.40–1.50)
GFR: 73.69 mL/min (ref >60.00)
Glucose, Bld: 76 mg/dL (ref 70–99)
Potassium: 4.5 mEq/L (ref 3.5–5.1)
Sodium: 141 mEq/L (ref 135–145)

## 2023-03-15 LAB — CBC WITH DIFFERENTIAL/PLATELET
Basophils Absolute: 0.1 10*3/uL (ref 0.0–0.1)
Basophils Relative: 0.7 % (ref 0.0–3.0)
Eosinophils Absolute: 0.1 10*3/uL (ref 0.0–0.7)
Eosinophils Relative: 1 % (ref 0.0–5.0)
HCT: 47.7 % (ref 39.0–52.0)
Hemoglobin: 15.8 g/dL (ref 13.0–17.0)
Lymphocytes Relative: 21 % (ref 12.0–46.0)
Lymphs Abs: 1.8 10*3/uL (ref 0.7–4.0)
MCHC: 33.1 g/dL (ref 30.0–36.0)
MCV: 93.1 fl (ref 78.0–100.0)
Monocytes Absolute: 1.1 10*3/uL — ABNORMAL HIGH (ref 0.1–1.0)
Monocytes Relative: 12.3 % — ABNORMAL HIGH (ref 3.0–12.0)
Neutro Abs: 5.7 10*3/uL (ref 1.4–7.7)
Neutrophils Relative %: 65 % (ref 43.0–77.0)
Platelets: 195 10*3/uL (ref 150.0–400.0)
RBC: 5.13 Mil/uL (ref 4.22–5.81)
RDW: 13.3 % (ref 11.5–15.5)
WBC: 8.7 10*3/uL (ref 4.0–10.5)

## 2023-03-15 LAB — LIPID PANEL
Cholesterol: 193 mg/dL (ref 0–200)
HDL: 41.8 mg/dL (ref >39.00)
NonHDL: 150.94
Total CHOL/HDL Ratio: 5
Triglycerides: 202 mg/dL — ABNORMAL HIGH (ref 0.0–149.0)
VLDL: 40.4 mg/dL — ABNORMAL HIGH (ref 0.0–40.0)

## 2023-03-15 LAB — HEPATIC FUNCTION PANEL
ALT: 28 U/L (ref 0–53)
AST: 27 U/L (ref 0–37)
Albumin: 4.1 g/dL (ref 3.5–5.2)
Alkaline Phosphatase: 62 U/L (ref 39–117)
Bilirubin, Direct: 0.1 mg/dL (ref 0.0–0.3)
Total Bilirubin: 0.5 mg/dL (ref 0.2–1.2)
Total Protein: 6.6 g/dL (ref 6.0–8.3)

## 2023-03-15 LAB — LDL CHOLESTEROL, DIRECT: Direct LDL: 104 mg/dL

## 2023-03-15 LAB — PSA, MEDICARE: PSA: 0.76 ng/ml (ref 0.10–4.00)

## 2023-03-15 NOTE — Progress Notes (Signed)
Established Patient Office Visit  Subjective   Patient ID: William English, male    DOB: March 17, 1956  Age: 67 y.o. MRN: [id number]  Chief Complaint  Patient presents with   Medicare Wellness    HPI   William English is here for physical exam.  He has history of obesity, hyperlipidemia, intermittent palpitations which he has had for about 30 years.  He infrequently takes low-dose metoprolol tartrate as needed for severe palpitations.  He has hyperlipidemia treated with rosuvastatin 10 mg daily.  Does have slightly raised skin lesion right leg which has been present for probably couple years but he thinks slowly growing.  Denies any injury.  Generally doing well.  Retired about 6 months ago.  Stays busy with 4 grandchildren.  Fifth on the way.  Health maintenance reviewed  -Needs Prevnar 20 -Gets annual flu vaccine -Has had previous Shingrix -Prior hepatitis C screen negative -Colonoscopy due 2033 -Tetanus due 2032  Social history-he is married.  Has 2 daughters and 4 grandsons with one on the way.  He just retired a couple months ago.  Non-smoker.   Family history-mother and father are deceased.  His mother had complication of kidney failure and apparently had hypercalcemia of uncertain etiology.  Both mother and father had hypertension.  Both parents apparently had atrial fibrillation.  Father died age 44 after diagnosis of Alzheimer disease around age 84.  He has a brother with MS.  Past Medical History:  Diagnosis Date   Abdominal pain, left upper quadrant 09/02/2009   History reviewed. No pertinent surgical history.  reports that he has never smoked. He has never used smokeless tobacco. He reports current alcohol use. He reports that he does not use drugs. family history includes Atrial fibrillation in his father and mother; Dementia in his father; Heart attack in his maternal grandfather, paternal grandfather, and paternal uncle; Heart disease in his father; Hyperlipidemia in his father and  mother; Multiple sclerosis in his brother. No Known Allergies   Review of Systems  Constitutional:  Negative for chills, fever, malaise/fatigue and weight loss.  HENT:  Negative for hearing loss.   Eyes:  Negative for blurred vision and double vision.  Respiratory:  Negative for cough and shortness of breath.   Cardiovascular:  Negative for chest pain, palpitations and leg swelling.  Gastrointestinal:  Negative for abdominal pain, blood in stool, constipation and diarrhea.  Genitourinary:  Negative for dysuria.  Skin:  Negative for rash.  Neurological:  Negative for dizziness, speech change, seizures, loss of consciousness and headaches.  Psychiatric/Behavioral:  Negative for depression.       Objective:     BP 110/70 (BP Location: Left Arm, Patient Position: Sitting, Cuff Size: Large)   Pulse 67   Temp 98.4 F (36.9 C) (Oral)   Ht 6' (1.829 m)   Wt 247 lb 14.4 oz (112.4 kg)   SpO2 99%   BMI 33.62 kg/m  BP Readings from Last 3 Encounters:  03/15/23 110/70  10/10/22 116/70  09/08/21 130/80   Wt Readings from Last 3 Encounters:  03/15/23 247 lb 14.4 oz (112.4 kg)  10/10/22 243 lb 1.6 oz (110.3 kg)  09/08/21 246 lb 9.6 oz (111.9 kg)      Physical Exam Vitals reviewed.  Constitutional:      General: He is not in acute distress.    Appearance: He is well-developed. He is not ill-appearing or toxic-appearing.  HENT:     Head: Normocephalic and atraumatic.     Right Ear: External  ear normal.     Left Ear: External ear normal.  Eyes:     Conjunctiva/sclera: Conjunctivae normal.     Pupils: Pupils are equal, round, and reactive to light.  Neck:     Thyroid: No thyromegaly.  Cardiovascular:     Rate and Rhythm: Normal rate and regular rhythm.     Heart sounds: Normal heart sounds. No murmur heard. Pulmonary:     Effort: No respiratory distress.     Breath sounds: No wheezing or rales.  Abdominal:     General: Bowel sounds are normal. There is no distension.      Palpations: Abdomen is soft. There is no mass.     Tenderness: There is no abdominal tenderness. There is no guarding or rebound.  Musculoskeletal:     Cervical back: Normal range of motion and neck supple.     Right lower leg: No edema.     Left lower leg: No edema.  Lymphadenopathy:     Cervical: No cervical adenopathy.  Skin:    Comments: Right anterior leg reveals approximately 10 x 6 mm slightly raised skin lesion with slight umbilication of the center.  No ulceration  Neurological:     Mental Status: He is alert and oriented to person, place, and time.     Cranial Nerves: No cranial nerve deficit.      No results found for any visits on 03/15/23.    The 10-year ASCVD risk score (Arnett DK, et al., 2019) is: 10.3%    Assessment & Plan:   Problem List Items Addressed This Visit   None Visit Diagnoses     Physical exam    -  Primary   Relevant Orders   Basic metabolic panel   Lipid panel   CBC with Differential/Platelet   Hepatic function panel   PSA, Medicare   Need for pneumococcal vaccination       Relevant Orders   Pneumococcal conjugate vaccine 20-valent (Prevnar 20) (Completed)     -He has hyperlipidemia treated with rosuvastatin.  Recheck lipid and hepatic panel today.  -Mildly raised skin lesion right leg.  Rule out early basal cell carcinoma.  Set up referral to skin surgery center  -Prevnar 20 given  -Continue annual flu vaccine  -Colonoscopy and other health maintenance up-to-date as above  No follow-ups on file.    Carolann Littler, MD

## 2023-03-15 NOTE — Patient Instructions (Signed)
We are giving Prevnar 20 today  I will be setting up dermatology referral.

## 2023-03-18 ENCOUNTER — Telehealth: Payer: Self-pay | Admitting: Family Medicine

## 2023-03-18 MED ORDER — ROSUVASTATIN CALCIUM 10 MG PO TABS: 10.0000 mg | ORAL_TABLET | Freq: Every day | ORAL | 3 refills | Status: DC

## 2023-03-18 NOTE — Telephone Encounter (Signed)
Rx sent 

## 2023-03-18 NOTE — Telephone Encounter (Signed)
Prescription Request  03/18/2023  LOV: 03/15/2023  What is the name of the medication or equipment? rosuvastatin (CRESTOR) 10 MG tablet  Have you contacted your pharmacy to request a refill? No   Which pharmacy would you like this sent to?  [facility], [place] - [address] [place] [address] Phone: [phone] Fax: [phone]     Patient notified that their request is being sent to the clinical staff for review and that they should receive a response within 2 business days.   Please advise at Mobile [phone] (mobile)

## 2023-04-10 DIAGNOSIS — K080 Exfoliation of teeth due to systemic causes: Secondary | ICD-10-CM | POA: Diagnosis not present

## 2023-05-07 DIAGNOSIS — H5213 Myopia, bilateral: Secondary | ICD-10-CM | POA: Diagnosis not present

## 2023-05-17 DIAGNOSIS — M79642 Pain in left hand: Secondary | ICD-10-CM | POA: Diagnosis not present

## 2023-06-11 DIAGNOSIS — L989 Disorder of the skin and subcutaneous tissue, unspecified: Secondary | ICD-10-CM | POA: Diagnosis not present

## 2023-06-11 DIAGNOSIS — D485 Neoplasm of uncertain behavior of skin: Secondary | ICD-10-CM | POA: Diagnosis not present

## 2023-06-11 DIAGNOSIS — L814 Other melanin hyperpigmentation: Secondary | ICD-10-CM | POA: Diagnosis not present

## 2023-06-11 DIAGNOSIS — H01004 Unspecified blepharitis left upper eyelid: Secondary | ICD-10-CM | POA: Diagnosis not present

## 2023-07-01 DIAGNOSIS — C44712 Basal cell carcinoma of skin of right lower limb, including hip: Secondary | ICD-10-CM | POA: Diagnosis not present

## 2023-08-28 DIAGNOSIS — M79672 Pain in left foot: Secondary | ICD-10-CM | POA: Diagnosis not present

## 2023-10-07 DIAGNOSIS — L570 Actinic keratosis: Secondary | ICD-10-CM | POA: Diagnosis not present

## 2023-10-07 DIAGNOSIS — Z08 Encounter for follow-up examination after completed treatment for malignant neoplasm: Secondary | ICD-10-CM | POA: Diagnosis not present

## 2023-10-07 DIAGNOSIS — D225 Melanocytic nevi of trunk: Secondary | ICD-10-CM | POA: Diagnosis not present

## 2023-10-07 DIAGNOSIS — L821 Other seborrheic keratosis: Secondary | ICD-10-CM | POA: Diagnosis not present

## 2023-10-07 DIAGNOSIS — L814 Other melanin hyperpigmentation: Secondary | ICD-10-CM | POA: Diagnosis not present

## 2023-10-14 DIAGNOSIS — K080 Exfoliation of teeth due to systemic causes: Secondary | ICD-10-CM | POA: Diagnosis not present

## 2023-11-13 ENCOUNTER — Ambulatory Visit: Payer: Medicare Other

## 2023-11-13 VITALS — Ht 72.0 in | Wt 247.0 lb

## 2023-11-13 DIAGNOSIS — Z0000 Encounter for general adult medical examination without abnormal findings: Secondary | ICD-10-CM | POA: Diagnosis not present

## 2023-11-13 NOTE — Progress Notes (Signed)
Subjective:   William English is a 67 y.o. male who presents for Medicare Annual/Subsequent preventive examination.  Visit Complete: Virtual I connected with  William English on 11/13/23 by a audio enabled telemedicine application and verified that I am speaking with the correct person using two identifiers.  Patient Location: Home  Provider Location: Home Office  I discussed the limitations of evaluation and management by telemedicine. The patient expressed understanding and agreed to proceed.  Vital Signs: Because this visit was a virtual/telehealth visit, some criteria may be missing or patient reported. Any vitals not documented were not able to be obtained and vitals that have been documented are patient reported.       Objective:    Today's Vitals   11/13/23 1122  Weight: 247 lb (112 kg)  Height: 6' (1.829 m)   Body mass index is 33.5 kg/m.     11/13/2023   11:31 AM  Advanced Directives  Does Patient Have a Medical Advance Directive? Yes  Type of Estate agent of [place];Living will  Copy of Healthcare Power of Attorney in Chart? No - copy requested    Current Medications (verified) Outpatient Encounter Medications as of 11/13/2023  Medication Sig   Ascorbic Acid (VITAMIN C) 100 MG tablet Take 100 mg by mouth daily.   aspirin 81 MG tablet Take 81 mg by mouth daily. Takes 1-2 times per week.   cyclobenzaprine (FLEXERIL) 10 MG tablet Take 1 tablet (10 mg total) by mouth 3 (three) times daily as needed for muscle spasms.   glucosamine-chondroitin 500-400 MG tablet Take 1 tablet by mouth daily.   metoprolol tartrate (LOPRESSOR) 25 MG tablet TAKE 1 TABLET BY MOUTH EVERY DAY AS NEEDED SEVERE PALPITATIONS   Multiple Vitamins-Minerals (MULTIVITAMIN WITH MINERALS) tablet Take 1 tablet by mouth daily.   omeprazole (PRILOSEC) 20 MG capsule Take 20 mg by mouth as needed.    rosuvastatin (CRESTOR) 10 MG tablet Take 1 tablet (10 mg total) by mouth daily.   Turmeric  (QC TUMERIC COMPLEX PO) Take 750 mg by mouth.   No facility-administered encounter medications on file as of 11/13/2023.    Allergies (verified) Patient has no known allergies.   History: Past Medical History:  Diagnosis Date   Abdominal pain, left upper quadrant 09/02/2009   History reviewed. No pertinent surgical history. Family History  Problem Relation Age of Onset   Atrial fibrillation Mother    Hyperlipidemia Mother    Heart disease Father        a fib   Atrial fibrillation Father    Hyperlipidemia Father    Dementia Father    Multiple sclerosis Brother    Heart attack Paternal Uncle    Heart attack Maternal Grandfather    Heart attack Paternal Grandfather    Social History   Socioeconomic History   Marital status: Married    Spouse name: Not on file   Number of children: Not on file   Years of education: Not on file   Highest education level: Not on file  Occupational History   Not on file  Tobacco Use   Smoking status: Never   Smokeless tobacco: Never  Substance and Sexual Activity   Alcohol use: Yes    Comment: occ glass of wine with dinner   Drug use: No   Sexual activity: Not on file  Other Topics Concern   Not on file  Social History Narrative   Not on file   Social Determinants of Health   Financial  Resource Strain: Low Risk  (11/13/2023)   Overall Financial Resource Strain (CARDIA)    Difficulty of Paying Living Expenses: Not hard at all  Food Insecurity: No Food Insecurity (11/13/2023)   Hunger Vital Sign    Worried About Running Out of Food in the Last Year: Never true    Ran Out of Food in the Last Year: Never true  Transportation Needs: No Transportation Needs (11/13/2023)   PRAPARE - Administrator, Civil Service (Medical): No    Lack of Transportation (Non-Medical): No  Physical Activity: Sufficiently Active (11/13/2023)   Exercise Vital Sign    Days of Exercise per Week: 7 days    Minutes of Exercise per Session: 30 min   Stress: No Stress Concern Present (11/13/2023)   [employer] of Occupational Health - Occupational Stress Questionnaire    Feeling of Stress : Not at all  Social Connections: Socially Integrated (11/13/2023)   Social Connection and Isolation Panel [NHANES]    Frequency of Communication with Friends and Family: More than three times a week    Frequency of Social Gatherings with Friends and Family: More than three times a week    Attends Religious Services: More than 4 times per year    Active Member of [employer] or Organizations: Yes    Attends Engineer, structural: More than 4 times per year    Marital Status: Married    Tobacco Counseling Counseling given: Not Answered   Clinical Intake:  Pre-visit preparation completed: Yes  Pain : No/denies pain     BMI - recorded: 33.5 Nutritional Status: BMI > 30  Obese Nutritional Risks: None Diabetes: No  How often do you need to have someone help you when you read instructions, pamphlets, or other written materials from your doctor or pharmacy?: 1 - Never  Interpreter Needed?: No  Information entered by :: William English   Activities of Daily Living    11/13/2023   11:28 AM  In your present state of health, do you have any difficulty performing the following activities:  Hearing? 0  Vision? 0  Difficulty concentrating or making decisions? 0  Walking or climbing stairs? 0  Dressing or bathing? 0  Doing errands, shopping? 0  Preparing Food and eating ? N  Using the Toilet? N  In the past six months, have you accidently leaked urine? N  Do you have problems with loss of bowel control? N  Managing your Medications? N  Managing your Finances? N  Housekeeping or managing your Housekeeping? N    Patient Care Team: William Covey, MD as PCP - General  Indicate any recent Medical Services you may have received from other than [facility] providers in the past year (date may be approximate).     Assessment:    This is a routine wellness examination for William English.  Hearing/Vision screen Hearing Screening - Comments:: Denies hearing difficulties   Vision Screening - Comments:: Wears rx glasses - up to date with routine eye exams with  William English   Goals Addressed               This Visit's Progress     Lose weight (pt-stated)         Depression Screen    11/13/2023   11:27 AM 03/15/2023   10:34 AM 10/10/2022    9:07 AM 06/07/2020    9:14 AM  PHQ 2/9 Scores  PHQ - 2 Score 0 0 0 0  Fall Risk    11/13/2023   11:29 AM 03/15/2023   10:34 AM  Fall Risk   Falls in the past year? 1 0  Number falls in past yr: 0 0  Injury with Fall? 1 0  Comment Left hand injury . Followed by medical attention   Risk for fall due to : No Fall Risks No Fall Risks  Follow up Falls prevention discussed Falls evaluation completed    MEDICARE RISK AT HOME: Medicare Risk at Home Any stairs in or around the home?: Yes If so, are there any without handrails?: No Home free of loose throw rugs in walkways, pet beds, electrical cords, etc?: Yes Adequate lighting in your home to reduce risk of falls?: Yes Life alert?: No Use of a cane, walker or w/c?: No Grab bars in the bathroom?: No Shower chair or bench in shower?: No Elevated toilet seat or a handicapped toilet?: No  TIMED UP AND GO:  Was the test performed?  No    Cognitive Function:        11/13/2023   11:31 AM  6CIT Screen  What Year? 0 points  What month? 0 points  What time? 0 points  Count back from 20 0 points  Months in reverse 0 points  Repeat phrase 0 points  Total Score 0 points    Immunizations Immunization History  Administered Date(s) Administered   Hepatitis A 01/04/2012, 06/11/2012   IPV 12/17/2013   Influenza Split 12/05/2011, 11/24/2012   Influenza Whole 12/05/2009, 10/27/2010   Influenza,inj,Quad PF,6+ Mos 11/18/2013, 10/08/2016, 10/09/2017, 10/07/2018, 09/08/2021, 10/10/2022   Influenza-Unspecified  10/25/2015   MMR 01/25/2012   Moderna Covid-19 Vaccine Bivalent Booster 27yrs & up 10/14/2021   PFIZER Comirnaty(Gray Top)Covid-19 Tri-Sucrose Vaccine 10/10/2022   PFIZER(Purple Top)SARS-COV-2 Vaccination 03/20/2020, 04/12/2020, 11/27/2020   PNEUMOCOCCAL CONJUGATE-20 03/15/2023   Tdap 12/12/2011, 09/08/2021   Typhoid Live 01/01/2012, 08/19/2018   Zoster Recombinant(Shingrix) 07/22/2018, 10/07/2018    TDAP status: Up to date  Flu Vaccine status: Due, Education has been provided regarding the importance of this vaccine. Advised may receive this vaccine at local pharmacy or Health Dept. Aware to provide a copy of the vaccination record if obtained from local pharmacy or Health Dept. Verbalized acceptance and understanding.  Pneumococcal vaccine status: Up to date  Covid-19 vaccine status: Declined, Education has been provided regarding the importance of this vaccine but patient still declined. Advised may receive this vaccine at local pharmacy or Health Dept.or vaccine clinic. Aware to provide a copy of the vaccination record if obtained from local pharmacy or Health Dept. Verbalized acceptance and understanding.  Qualifies for Shingles Vaccine? Yes   Zostavax completed Yes   Shingrix Completed?: Yes  Screening Tests Health Maintenance  Topic Date Due   INFLUENZA VACCINE  08/01/2023   COVID-19 Vaccine (6 - 2023-24 season) 09/01/2023   Medicare Annual Wellness (AWV)  11/12/2024   DTaP/Tdap/Td (3 - Td or Tdap) 09/09/2031   Colonoscopy  01/24/2032   Pneumonia Vaccine 11+ Years old  Completed   Hepatitis C Screening  Completed   Zoster Vaccines- Shingrix  Completed   HPV VACCINES  Aged Out    Health Maintenance  Health Maintenance Due  Topic Date Due   INFLUENZA VACCINE  08/01/2023   COVID-19 Vaccine (6 - 2023-24 season) 09/01/2023    Colorectal cancer screening: Type of screening: Colonoscopy. Completed 01/23/22. Repeat every 10 years    Additional Screening:  Hepatitis C  Screening: does qualify; Completed 07/01/18  Vision Screening: Recommended annual ophthalmology  exams for early detection of glaucoma and other disorders of the eye. Is the patient up to date with their annual eye exam?  Yes  Who is the provider or what is the name of the office in which the patient attends annual eye exams? William English If pt is not established with a provider, would they like to be referred to a provider to establish care? No .   Dental Screening: Recommended annual dental exams for proper oral hygiene    Community Resource Referral / Chronic Care Management:  CRR required this visit?  No   CCM required this visit?  No     Plan:     I have personally reviewed and noted the following in the patient's chart:   Medical and social history Use of alcohol, tobacco or illicit drugs  Current medications and supplements including opioid prescriptions. Patient is not currently taking opioid prescriptions. Functional ability and status Nutritional status Physical activity Advanced directives List of other physicians Hospitalizations, surgeries, and ER visits in previous 12 months Vitals Screenings to include cognitive, depression, and falls Referrals and appointments  In addition, I have reviewed and discussed with patient certain preventive protocols, quality metrics, and best practice recommendations. A written personalized care plan for preventive services as well as general preventive health recommendations were provided to patient.     Tillie Rung, English   09/81/1914   After Visit Summary: (MyChart) Due to this being a telephonic visit, the after visit summary with patients personalized plan was offered to patient via MyChart   Nurse Notes: None

## 2023-11-13 NOTE — Patient Instructions (Addendum)
William English , Thank you for taking time to come for your Medicare Wellness Visit. I appreciate your ongoing commitment to your health goals. Please review the following plan we discussed and let me know if I can assist you in the future.   Referrals/Orders/Follow-Ups/Clinician Recommendations:   This is a list of the screening recommended for you and due dates:  Health Maintenance  Topic Date Due   Flu Shot  08/01/2023   COVID-19 Vaccine (6 - 2023-24 season) 09/01/2023   Medicare Annual Wellness Visit  11/12/2024   DTaP/Tdap/Td vaccine (3 - Td or Tdap) 09/09/2031   Colon Cancer Screening  01/24/2032   Pneumonia Vaccine  Completed   Hepatitis C Screening  Completed   Zoster (Shingles) Vaccine  Completed   HPV Vaccine  Aged Out    Advanced directives: (Copy Requested) Please bring a copy of your health care power of attorney and living will to the office to be added to your chart at your convenience.  Next Medicare Annual Wellness Visit scheduled for next year: Yes

## 2023-11-15 ENCOUNTER — Ambulatory Visit (INDEPENDENT_AMBULATORY_CARE_PROVIDER_SITE_OTHER): Payer: Medicare Other | Admitting: Family Medicine

## 2023-11-15 ENCOUNTER — Encounter: Payer: Self-pay | Admitting: Family Medicine

## 2023-11-15 VITALS — BP 122/70 | HR 64 | Temp 97.7°F | Ht 72.0 in | Wt 243.3 lb

## 2023-11-15 DIAGNOSIS — Z23 Encounter for immunization: Secondary | ICD-10-CM | POA: Diagnosis not present

## 2023-11-15 DIAGNOSIS — E781 Pure hyperglyceridemia: Secondary | ICD-10-CM

## 2023-11-15 DIAGNOSIS — M546 Pain in thoracic spine: Secondary | ICD-10-CM | POA: Diagnosis not present

## 2023-11-15 DIAGNOSIS — R109 Unspecified abdominal pain: Secondary | ICD-10-CM | POA: Diagnosis not present

## 2023-11-15 DIAGNOSIS — M25572 Pain in left ankle and joints of left foot: Secondary | ICD-10-CM | POA: Diagnosis not present

## 2023-11-15 LAB — URINALYSIS, ROUTINE W REFLEX MICROSCOPIC
Bilirubin Urine: NEGATIVE
Hgb urine dipstick: NEGATIVE
Ketones, ur: NEGATIVE
Leukocytes,Ua: NEGATIVE
Nitrite: NEGATIVE
Specific Gravity, Urine: 1.02 (ref 1.000–1.030)
Total Protein, Urine: NEGATIVE
Urine Glucose: NEGATIVE
Urobilinogen, UA: 0.2 (ref 0.0–1.0)
pH: 6.5 (ref 5.0–8.0)

## 2023-11-15 MED ORDER — METHOCARBAMOL 750 MG PO TABS: 750.0000 mg | ORAL_TABLET | Freq: Four times a day (QID) | ORAL | 0 refills | Status: AC | PRN

## 2023-11-15 MED ORDER — PREDNISONE 20 MG PO TABS: ORAL_TABLET | ORAL | 0 refills | Status: DC

## 2023-11-15 NOTE — Progress Notes (Signed)
Established Patient Office Visit  Subjective   Patient ID: [place], male    DOB: 10/02/1956  Age: 67 y.o. MRN: [id number]  Chief Complaint  Patient presents with   Annual Exam    HPI   [place] was initially scheduled for a physical exam.  However, he had complete physical last March and also had Medicare wellness visit just recently.  He does have several other issues to discuss today.  History of elevated triglycerides.  These have been relatively mildly elevated with most recent level over 200.  He does take statin-Crestor 10 mg daily.  Stays very active and exercises usually 5 days/week.  No family history of premature CAD  Recent episode of acute left ankle pain.  He recalls day before onset jumping off a small wall about 3-1/2 to 4 feet high.  Did not have any kind of ankle inversion or injury or sprain.  Went to orthopedics.  X-ray no fracture.  Was prescribed prednisone and had prompt improvement.  He wonders in retrospect if he may have had gout.  He states both parents had history of gout.  He did not recall any redness or warmth.  Requesting prescription of prednisone today for any future recurrence  Also relates frequent episodes of left upper thoracic back pain.  He thinks it may be muscular.  Does lift weights frequently and does some light exercises.  Has tried Flexeril in the past without much improvement.  Also occasional pains around his left flank/kidney region.  No history of kidney stones.  No gross hematuria.  No burning with urination.  Appetite and weight stable.  Past Medical History:  Diagnosis Date   Abdominal pain, left upper quadrant 09/02/2009   History reviewed. No pertinent surgical history.  reports that he has never smoked. He has never used smokeless tobacco. He reports current alcohol use. He reports that he does not use drugs. family history includes Atrial fibrillation in his father and mother; Dementia in his father; Heart attack in his maternal  grandfather, paternal grandfather, and paternal uncle; Heart disease in his father; Hyperlipidemia in his father and mother; Multiple sclerosis in his brother. No Known Allergies  Review of Systems  Constitutional:  Negative for chills, fever and malaise/fatigue.  Eyes:  Negative for blurred vision.  Respiratory:  Negative for shortness of breath.   Cardiovascular:  Negative for chest pain.  Musculoskeletal:  Positive for back pain.  Neurological:  Negative for dizziness, weakness and headaches.      Objective:     BP 122/70 (BP Location: Left Arm, Patient Position: Sitting, Cuff Size: Large)   Pulse 64   Temp 97.7 F (36.5 C) (Oral)   Ht 6' (1.829 m)   Wt 243 lb 4.8 oz (110.4 kg)   SpO2 97%   BMI 33.00 kg/m  BP Readings from Last 3 Encounters:  11/15/23 122/70  03/15/23 110/70  10/10/22 116/70   Wt Readings from Last 3 Encounters:  11/15/23 243 lb 4.8 oz (110.4 kg)  11/13/23 247 lb (112 kg)  03/15/23 247 lb 14.4 oz (112.4 kg)      Physical Exam Vitals reviewed.  Constitutional:      General: He is not in acute distress.    Appearance: He is not ill-appearing.  Cardiovascular:     Rate and Rhythm: Normal rate and regular rhythm.  Pulmonary:     Effort: Pulmonary effort is normal.     Breath sounds: Normal breath sounds.  Musculoskeletal:     Comments:  No spinal tenderness.  Neurological:     General: No focal deficit present.     Mental Status: He is alert.     Cranial Nerves: No cranial nerve deficit.      No results found for any visits on 11/15/23.  Last CBC Lab Results  Component Value Date   WBC 8.7 03/15/2023   HGB 15.8 03/15/2023   HCT 47.7 03/15/2023   MCV 93.1 03/15/2023   RDW 13.3 03/15/2023   PLT 195.0 03/15/2023   Last metabolic panel Lab Results  Component Value Date   GLUCOSE 76 03/15/2023   NA 141 03/15/2023   K 4.5 03/15/2023   CL 105 03/15/2023   CO2 27 03/15/2023   BUN 26 (H) 03/15/2023   CREATININE 1.06 03/15/2023   GFR  73.69 03/15/2023   CALCIUM 9.4 03/15/2023   PROT 6.6 03/15/2023   ALBUMIN 4.1 03/15/2023   BILITOT 0.5 03/15/2023   ALKPHOS 62 03/15/2023   AST 27 03/15/2023   ALT 28 03/15/2023   Last lipids Lab Results  Component Value Date   CHOL 193 03/15/2023   HDL 41.80 03/15/2023   LDLCALC 121 (H) 10/10/2022   LDLDIRECT 104.0 03/15/2023   TRIG 202.0 (H) 03/15/2023   CHOLHDL 5 03/15/2023   Last thyroid functions Lab Results  Component Value Date   TSH 1.19 10/10/2022      The 10-year ASCVD risk score (Arnett DK, et al., 2019) is: 13.6%    Assessment & Plan:   #1 history of hypertriglyceridemia.  These have been relatively mild- usually 200 range.  Does have hyperlipidemia treated with rosuvastatin.  We discussed dietary factors with regard to triglycerides.  Try to keep down high glycemic foods and increase natural sources of omega-3.  Last lipid check last March  #2 recent bout about 3 months ago of acute left ankle pain.  Initial question is whether this was injury related versus gout.  He had very prompt improvement with prednisone and in hindsight he thinks this may have been a gout flare.  We discussed more definitive diagnosis if he has future flareups to be in touch.  We did agree to give him 1 refill of prednisone in case he has similar flareup on the weekend or out of town.  #3 intermittent left thoracic back pain.  Sounds more muscular.  Recommend stretching and conservative measures including heat and topical sports creams.  Wrote for Robaxin 750 mg 1 every 6 hours as needed for muscle spasm and caution about potential side effects such as sedation.  Consider sports medicine assessment versus PT for any ongoing issues.  He did also mention some intermittent left flank pain around the kidney region which is somewhat different location.  Decided to check urinalysis to be safe.  No known history of kidney stones.  Flu vaccine given   No follow-ups on file.    Evelena Peat, MD

## 2024-03-06 ENCOUNTER — Other Ambulatory Visit: Payer: Self-pay | Admitting: Family Medicine

## 2024-03-17 DIAGNOSIS — H5213 Myopia, bilateral: Secondary | ICD-10-CM | POA: Diagnosis not present

## 2024-05-05 DIAGNOSIS — K080 Exfoliation of teeth due to systemic causes: Secondary | ICD-10-CM | POA: Diagnosis not present

## 2024-05-15 ENCOUNTER — Ambulatory Visit (INDEPENDENT_AMBULATORY_CARE_PROVIDER_SITE_OTHER): Payer: Medicare Other | Admitting: Family Medicine

## 2024-05-15 VITALS — BP 120/66 | HR 56 | Temp 97.8°F | Wt 247.1 lb

## 2024-05-15 DIAGNOSIS — Z125 Encounter for screening for malignant neoplasm of prostate: Secondary | ICD-10-CM | POA: Diagnosis not present

## 2024-05-15 DIAGNOSIS — E785 Hyperlipidemia, unspecified: Secondary | ICD-10-CM | POA: Diagnosis not present

## 2024-05-15 DIAGNOSIS — I493 Ventricular premature depolarization: Secondary | ICD-10-CM | POA: Diagnosis not present

## 2024-05-15 LAB — LIPID PANEL
Cholesterol: 208 mg/dL — ABNORMAL HIGH (ref 0–200)
HDL: 40.7 mg/dL (ref >39.00)
LDL Cholesterol: 143 mg/dL — ABNORMAL HIGH (ref 0–99)
NonHDL: 167.79
Total CHOL/HDL Ratio: 5
Triglycerides: 124 mg/dL (ref 0.0–149.0)
VLDL: 24.8 mg/dL (ref 0.0–40.0)

## 2024-05-15 LAB — COMPREHENSIVE METABOLIC PANEL WITH GFR
ALT: 37 U/L (ref 0–53)
AST: 32 U/L (ref 0–37)
Albumin: 4.3 g/dL (ref 3.5–5.2)
Alkaline Phosphatase: 66 U/L (ref 39–117)
BUN: 27 mg/dL — ABNORMAL HIGH (ref 6–23)
CO2: 29 meq/L (ref 19–32)
Calcium: 9.3 mg/dL (ref 8.4–10.5)
Chloride: 104 meq/L (ref 96–112)
Creatinine, Ser: 0.9 mg/dL (ref 0.40–1.50)
GFR: 88.95 mL/min (ref >60.00)
Glucose, Bld: 93 mg/dL (ref 70–99)
Potassium: 4.4 meq/L (ref 3.5–5.1)
Sodium: 140 meq/L (ref 135–145)
Total Bilirubin: 0.7 mg/dL (ref 0.2–1.2)
Total Protein: 6.7 g/dL (ref 6.0–8.3)

## 2024-05-15 MED ORDER — ROSUVASTATIN CALCIUM 10 MG PO TABS: 10.0000 mg | ORAL_TABLET | Freq: Every day | ORAL | 3 refills | Status: DC

## 2024-05-15 NOTE — Progress Notes (Signed)
 Established Patient Office Visit  Subjective   Patient ID: [place], male    DOB: 10/20/1956  Age: 68 y.o. MRN: [id number]  Chief Complaint  Patient presents with   Medical Management of Chronic Issues    HPI   [place] is seen for medical follow-up.  Generally doing well.  He has 2 daughters and 5 grandsons.  2 of his grandsons live in [place] and the other 3 live up in [place]  about [place]. 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  Takes a low-dose metoprolol  tartrate 25 mg 1/2 tablet as needed for severe flareups.  No clear provoking factors.  Hyperlipidemia treated with rosuvastatin  10 mg daily.  He had a paternal grandfather and some uncles that had history of CAD.  Last lipids were March 2024.  He denies any myalgias or other side effects from rosuvastatin .  No longer takes aspirin.  He states he had basal cell skin cancer taken off right lower leg since last visit.  Past Medical History:  Diagnosis Date   Abdominal pain, left upper quadrant 09/02/2009   History reviewed. No pertinent surgical history.  reports that he has never smoked. He has never used smokeless tobacco. He reports current alcohol use. He reports that he does not use drugs. family history includes Atrial fibrillation in his father and mother; Dementia in his father; Heart attack in his maternal grandfather, paternal grandfather, and paternal uncle; Heart disease in his father; Hyperlipidemia in his father and mother; Multiple sclerosis in his brother. No Known Allergies  Review of Systems  Constitutional:  Negative for malaise/fatigue.  Eyes:  Negative for blurred vision.  Respiratory:  Negative for shortness of breath.   Cardiovascular:  Negative for chest pain.  Neurological:  Negative for dizziness and headaches.      Objective:     BP 120/66 (BP Location: Left Arm, Patient Position: Sitting, Cuff Size: Large)   Pulse (!) 56   Temp 97.8 F (36.6  C) (Oral)   Wt 247 lb 1.6 oz (112.1 kg)   SpO2 95%   BMI 33.51 kg/m  BP Readings from Last 3 Encounters:  05/15/24 120/66  11/15/23 122/70  03/15/23 110/70   Wt Readings from Last 3 Encounters:  05/15/24 247 lb 1.6 oz (112.1 kg)  11/15/23 243 lb 4.8 oz (110.4 kg)  11/13/23 247 lb (112 kg)      Physical Exam Vitals reviewed.  Constitutional:      General: He is not in acute distress.    Appearance: He is not ill-appearing.  Cardiovascular:     Rate and Rhythm: Normal rate and regular rhythm.  Pulmonary:     Effort: Pulmonary effort is normal.     Breath sounds: Normal breath sounds. No wheezing or rales.  Musculoskeletal:     Right lower leg: No edema.     Left lower leg: No edema.  Neurological:     Mental Status: He is alert.      No results found for any visits on 05/15/24.  Last CBC Lab Results  Component Value Date   WBC 8.7 03/15/2023   HGB 15.8 03/15/2023   HCT 47.7 03/15/2023   MCV 93.1 03/15/2023   RDW 13.3 03/15/2023   PLT 195.0 03/15/2023   Last metabolic panel Lab Results  Component Value Date   GLUCOSE 76 03/15/2023   NA 141 03/15/2023   K 4.5 03/15/2023   CL 105 03/15/2023   CO2 27 03/15/2023  BUN 26 (H) 03/15/2023   CREATININE 1.06 03/15/2023   GFR 73.69 03/15/2023   CALCIUM  9.4 03/15/2023   PROT 6.6 03/15/2023   ALBUMIN 4.1 03/15/2023   BILITOT 0.5 03/15/2023   ALKPHOS 62 03/15/2023   AST 27 03/15/2023   ALT 28 03/15/2023   Last lipids Lab Results  Component Value Date   CHOL 193 03/15/2023   HDL 41.80 03/15/2023   LDLCALC 121 (H) 10/10/2022   LDLDIRECT 104.0 03/15/2023   TRIG 202.0 (H) 03/15/2023   CHOLHDL 5 03/15/2023   Last hemoglobin A1c No results found for: "HGBA1C" Last thyroid  functions Lab Results  Component Value Date   TSH 1.19 10/10/2022      The 10-year ASCVD risk score (Arnett DK, et al., 2019) is: 13.3%    Assessment & Plan:   #1 hyperlipidemia treated with rosuvastatin  10 mg daily.  Due for  follow-up labs.  Check lipid and CMP.  Refill rosuvastatin  for 1 year.  Continue low saturated fat diet  #2 history of frequent PVCs.  Patient aware of potential triggers such as caffeine.  Continue low-dose metoprolol  tartrate as needed.  We did mention of these become more symptomatic or problematic over time could refer back to EP specialist  #3 prostate cancer screening.  Patient would like to get repeat PSA.  Will add this to labs.  He has been consistently low in the past   No follow-ups on file.    Glean Lamy, MD

## 2024-05-19 LAB — PSA, MEDICARE: PSA: 0.84 ng/mL (ref 0.10–4.00)

## 2024-05-21 ENCOUNTER — Telehealth: Payer: Self-pay | Admitting: *Deleted

## 2024-05-21 ENCOUNTER — Ambulatory Visit: Payer: Self-pay | Admitting: Family Medicine

## 2024-05-21 NOTE — Telephone Encounter (Signed)
 Copied from CRM [id number]. Topic: Clinical - Lab/Test Results >> May 21, 2024  4:21 PM Albertha Alosa wrote: Reason for CRM: Patient called in regarding missed call, relay results to patient per note, patient stated he understood and had no further questions

## 2024-05-22 MED ORDER — ROSUVASTATIN CALCIUM 20 MG PO TABS: 20.0000 mg | ORAL_TABLET | Freq: Every day | ORAL | 0 refills | Status: DC

## 2024-05-22 NOTE — Addendum Note (Signed)
 Addended by: Aurelio Leer on: 05/22/2024 08:24 AM   Modules accepted: Orders

## 2024-05-22 NOTE — Telephone Encounter (Signed)
 Please see result note

## 2024-08-16 ENCOUNTER — Other Ambulatory Visit: Payer: Self-pay | Admitting: Family Medicine

## 2024-08-24 ENCOUNTER — Other Ambulatory Visit: Payer: Self-pay | Admitting: Family Medicine

## 2024-08-24 ENCOUNTER — Other Ambulatory Visit (INDEPENDENT_AMBULATORY_CARE_PROVIDER_SITE_OTHER)

## 2024-08-24 DIAGNOSIS — E785 Hyperlipidemia, unspecified: Secondary | ICD-10-CM

## 2024-08-24 LAB — LIPID PANEL
Cholesterol: 186 mg/dL (ref 0–200)
HDL: 37.7 mg/dL — ABNORMAL LOW (ref >39.00)
LDL Cholesterol: 122 mg/dL — ABNORMAL HIGH (ref 0–99)
NonHDL: 148.53
Total CHOL/HDL Ratio: 5
Triglycerides: 135 mg/dL (ref 0.0–149.0)
VLDL: 27 mg/dL (ref 0.0–40.0)

## 2024-08-25 ENCOUNTER — Ambulatory Visit: Payer: Self-pay | Admitting: Family Medicine

## 2024-10-15 DIAGNOSIS — D1801 Hemangioma of skin and subcutaneous tissue: Secondary | ICD-10-CM | POA: Diagnosis not present

## 2024-10-15 DIAGNOSIS — L814 Other melanin hyperpigmentation: Secondary | ICD-10-CM | POA: Diagnosis not present

## 2024-10-15 DIAGNOSIS — D2361 Other benign neoplasm of skin of right upper limb, including shoulder: Secondary | ICD-10-CM | POA: Diagnosis not present

## 2024-10-15 DIAGNOSIS — L821 Other seborrheic keratosis: Secondary | ICD-10-CM | POA: Diagnosis not present

## 2024-11-18 ENCOUNTER — Ambulatory Visit: Payer: Medicare Other

## 2024-11-18 VITALS — BP 120/60 | HR 74 | Temp 98.5°F | Ht 72.0 in | Wt 246.6 lb

## 2024-11-18 DIAGNOSIS — Z23 Encounter for immunization: Secondary | ICD-10-CM

## 2024-11-18 DIAGNOSIS — Z0000 Encounter for general adult medical examination without abnormal findings: Secondary | ICD-10-CM

## 2024-11-18 NOTE — Progress Notes (Signed)
 Chief Complaint  Patient presents with   Medicare Wellness     Subjective:   [place] is a 68 y.o. male who presents for a Medicare Annual Wellness Visit.  Allergies (verified) Patient has no known allergies.   History: Past Medical History:  Diagnosis Date   Abdominal pain, left upper quadrant 09/02/2009   History reviewed. No pertinent surgical history. Family History  Problem Relation Age of Onset   Atrial fibrillation Mother    Hyperlipidemia Mother    Heart disease Father        a fib   Atrial fibrillation Father    Hyperlipidemia Father    Dementia Father    Multiple sclerosis Brother    Heart attack Paternal Uncle    Heart attack Maternal Grandfather    Heart attack Paternal Grandfather    Social History   Occupational History   Not on file  Tobacco Use   Smoking status: Never   Smokeless tobacco: Never  Substance and Sexual Activity   Alcohol use: Yes    Comment: occ glass of wine with dinner   Drug use: No   Sexual activity: Not on file   Tobacco Counseling Counseling given: No  SDOH Screenings   Food Insecurity: No Food Insecurity (11/18/2024)  Housing: Unknown (11/18/2024)  Transportation Needs: No Transportation Needs (11/18/2024)  Utilities: Not At Risk (11/18/2024)  Alcohol Screen: Low Risk  (05/15/2024)  Depression (PHQ2-9): Low Risk  (11/18/2024)  Financial Resource Strain: Low Risk  (05/15/2024)  Physical Activity: Sufficiently Active (11/18/2024)  Social Connections: Socially Integrated (11/18/2024)  Stress: No Stress Concern Present (11/18/2024)  Tobacco Use: Low Risk  (11/18/2024)  Health Literacy: Adequate Health Literacy (11/18/2024)   See flowsheets for full screening details  Depression Screen PHQ 2 & 9 Depression Scale- Over the past 2 weeks, how often have you been bothered by any of the following problems? Little interest or pleasure in doing things: 0 Feeling down, depressed, or hopeless (PHQ Adolescent also  includes...irritable): 0 PHQ-2 Total Score: 0     Goals Addressed               This Visit's Progress     Continue physical activity (pt-stated)        Remain active.       Visit info / Clinical Intake: Medicare Wellness Visit Type:: Subsequent Annual Wellness Visit Persons participating in visit:: patient Medicare Wellness Visit Mode:: In-person (required for WTM) Information given by:: patient Interpreter Needed?: No Pre-visit prep was completed: no AWV questionnaire completed by patient prior to visit?: no Living arrangements:: lives with spouse/significant other Patient's Overall Health Status Rating: very good Typical amount of pain: none Does pain affect daily life?: no Are you currently prescribed opioids?: no  Dietary Habits and Nutritional Risks How many meals a day?: 3 Eats fruit and vegetables daily?: yes Most meals are obtained by: preparing own meals In the last 2 weeks, have you had any of the following?: none Diabetic:: no  Functional Status Activities of Daily Living (to include ambulation/medication): Independent Ambulation: Independent with device- listed below Home Assistive Devices/Equipment: Eyeglasses Medication Administration: Independent Home Management: Independent Manage your own finances?: yes Primary transportation is: driving Concerns about vision?: no *vision screening is required for WTM* Concerns about hearing?: no  Fall Screening Falls in the past year?: 0 Number of falls in past year: 0 Was there an injury with Fall?: 0 Fall Risk Category Calculator: 0 Patient Fall Risk Level: Low Fall Risk  Fall Risk Patient  at Risk for Falls Due to: No Fall Risks  Home and Transportation Safety: All rugs have non-skid backing?: N/A, no rugs All stairs or steps have railings?: yes Grab bars in the bathtub or shower?: (!) no Have non-skid surface in bathtub or shower?: yes Good home lighting?: yes Regular seat belt use?: yes Hospital  stays in the last year:: no  Cognitive Assessment Difficulty concentrating, remembering, or making decisions? : no Will 6CIT or Mini Cog be Completed: no  Advance Directives (For Healthcare) Does Patient Have a Medical Advance Directive?: Yes Does patient want to make changes to medical advance directive?: No - Patient declined Type of Advance Directive: Healthcare Power of [place]; Living will Copy of Healthcare Power of Attorney in Chart?: No - copy requested Copy of Living Will in Chart?: No - copy requested  Reviewed/Updated  Reviewed/Updated: Reviewed All (Medical, Surgical, Family, Medications, Allergies, Care Teams, Patient Goals)        Objective:    Today's Vitals   11/18/24 1120  BP: 120/60  Pulse: 74  Temp: 98.5 F (36.9 C)  TempSrc: Oral  SpO2: 99%  Weight: 246 lb 9.6 oz (111.9 kg)  Height: 6' (1.829 m)   Body mass index is 33.44 kg/m.  Current Medications (verified) Outpatient Encounter Medications as of 11/18/2024  Medication Sig   Ascorbic Acid (VITAMIN C) 100 MG tablet Take 100 mg by mouth daily.   glucosamine-chondroitin 500-400 MG tablet Take 1 tablet by mouth daily.   methocarbamol  (ROBAXIN -750) 750 MG tablet Take 1 tablet (750 mg total) by mouth every 6 (six) hours as needed for muscle spasms.   metoprolol  tartrate (LOPRESSOR ) 25 MG tablet TAKE 1 TABLET BY MOUTH EVERY DAY AS NEEDED SEVERE PALPITATIONS   Multiple Vitamins-Minerals (MULTIVITAMIN WITH MINERALS) tablet Take 1 tablet by mouth daily.   omeprazole (PRILOSEC) 20 MG capsule Take 20 mg by mouth as needed.    rosuvastatin  (CRESTOR ) 20 MG tablet TAKE 1 TABLET BY MOUTH EVERY DAY   Turmeric (QC TUMERIC COMPLEX PO) Take 750 mg by mouth.   No facility-administered encounter medications on file as of 11/18/2024.   Hearing/Vision screen Hearing Screening - Comments:: Denies hearing difficulties   Vision Screening - Comments:: Wears rx glasses - up to date with routine eye exams with  [facility]  Immunizations and Health Maintenance Health Maintenance  Topic Date Due   COVID-19 Vaccine (6 - 2025-26 season) 08/31/2024   Medicare Annual Wellness (AWV)  11/18/2025   DTaP/Tdap/Td (3 - Td or Tdap) 09/09/2031   Colonoscopy  01/24/2032   Pneumococcal Vaccine: 50+ Years  Completed   Influenza Vaccine  Completed   Hepatitis C Screening  Completed   Zoster Vaccines- Shingrix   Completed   Meningococcal B Vaccine  Aged Out        Assessment/Plan:  This is a routine wellness examination for [place].  Patient Care Team: Micheal Wolm ORN, MD as PCP - General  I have personally reviewed and noted the following in the patient's chart:   Medical and social history Use of alcohol, tobacco or illicit drugs  Current medications and supplements including opioid prescriptions. Functional ability and status Nutritional status Physical activity Advanced directives List of other physicians Hospitalizations, surgeries, and ER visits in previous 12 months Vitals Screenings to include cognitive, depression, and falls Referrals and appointments  Orders Placed This Encounter  Procedures   Flu vaccine HIGH DOSE PF(Fluzone Trivalent)   In addition, I have reviewed and discussed with patient certain preventive protocols, quality  metrics, and best practice recommendations. A written personalized care plan for preventive services as well as general preventive health recommendations were provided to patient.   Rojelio LELON Blush, LPN   [id number]/[id number]    Return in 1 year on 11/24/25  After Visit Summary: (In Person-Declined) Patient declined AVS at this time.  Nurse Notes: None

## 2024-11-18 NOTE — Patient Instructions (Addendum)
 [place],  Thank you for taking the time for your Medicare Wellness Visit. I appreciate your continued commitment to your health goals. Please review the care plan we discussed, and feel free to reach out if I can assist you further.  Please note that Annual Wellness Visits do not include a physical exam. Some assessments may be limited, especially if the visit was conducted virtually. If needed, we may recommend an in-person follow-up with your provider.  Ongoing Care Seeing your primary care provider every 3 to 6 months helps us  monitor your health and provide consistent, personalized care.   Referrals If a referral was made during today's visit and you haven't received any updates within two weeks, please contact the referred provider directly to check on the status.  Recommended Screenings:  Health Maintenance  Topic Date Due   COVID-19 Vaccine (6 - 2025-26 season) 08/31/2024   Medicare Annual Wellness Visit  11/18/2025   DTaP/Tdap/Td vaccine (3 - Td or Tdap) 09/09/2031   Colon Cancer Screening  01/24/2032   Pneumococcal Vaccine for age over 56  Completed   Flu Shot  Completed   Hepatitis C Screening  Completed   Zoster (Shingles) Vaccine  Completed   Meningitis B Vaccine  Aged Out       11/18/2024   11:31 AM  Advanced Directives  Does Patient Have a Medical Advance Directive? Yes  Type of Estate Agent of [place];Living will  Does patient want to make changes to medical advance directive? No - Patient declined  Copy of Healthcare Power of Attorney in Chart? No - copy requested    Vision: Annual vision screenings are recommended for early detection of glaucoma, cataracts, and diabetic retinopathy. These exams can also reveal signs of chronic conditions such as diabetes and high blood pressure.  Dental: Annual dental screenings help detect early signs of oral cancer, gum disease, and other conditions linked to overall health, including heart disease and  diabetes.  Please see the attached documents for additional preventive care recommendations.

## 2024-11-19 DIAGNOSIS — K080 Exfoliation of teeth due to systemic causes: Secondary | ICD-10-CM | POA: Diagnosis not present

## 2024-11-30 DIAGNOSIS — R1013 Epigastric pain: Secondary | ICD-10-CM | POA: Diagnosis not present

## 2024-11-30 DIAGNOSIS — K219 Gastro-esophageal reflux disease without esophagitis: Secondary | ICD-10-CM | POA: Diagnosis not present

## 2024-12-10 DIAGNOSIS — K2980 Duodenitis without bleeding: Secondary | ICD-10-CM | POA: Diagnosis not present

## 2024-12-10 DIAGNOSIS — K317 Polyp of stomach and duodenum: Secondary | ICD-10-CM | POA: Diagnosis not present

## 2024-12-10 DIAGNOSIS — K2950 Unspecified chronic gastritis without bleeding: Secondary | ICD-10-CM | POA: Diagnosis not present

## 2024-12-10 DIAGNOSIS — K2100 Gastro-esophageal reflux disease with esophagitis, without bleeding: Secondary | ICD-10-CM | POA: Diagnosis not present

## 2024-12-10 DIAGNOSIS — R1013 Epigastric pain: Secondary | ICD-10-CM | POA: Diagnosis not present

## 2025-01-11 ENCOUNTER — Other Ambulatory Visit: Payer: Self-pay | Admitting: Gastroenterology

## 2025-01-11 DIAGNOSIS — R1011 Right upper quadrant pain: Secondary | ICD-10-CM

## 2025-01-15 ENCOUNTER — Ambulatory Visit
Admission: RE | Admit: 2025-01-15 | Discharge: 2025-01-15 | Disposition: A | Discharge status: Home / Self Care | Source: Ambulatory Visit | Attending: Gastroenterology | Admitting: Gastroenterology

## 2025-01-15 DIAGNOSIS — R1011 Right upper quadrant pain: Secondary | ICD-10-CM

## 2025-11-24 ENCOUNTER — Ambulatory Visit
